# Patient Record
Sex: Female | Born: 1980 | Race: Black or African American | Hispanic: No | Marital: Single | Smoking: Former smoker
Health system: Southern US, Community
[De-identification: ages and names within clinical notes are randomized; demographics above are authoritative.]

## PROBLEM LIST (undated history)

## (undated) ENCOUNTER — Ambulatory Visit (HOSPITAL_COMMUNITY): Admission: EM | Payer: Medicaid Other | Source: Home / Self Care

## (undated) DIAGNOSIS — O24419 Gestational diabetes mellitus in pregnancy, unspecified control: Secondary | ICD-10-CM

## (undated) DIAGNOSIS — O139 Gestational [pregnancy-induced] hypertension without significant proteinuria, unspecified trimester: Secondary | ICD-10-CM

## (undated) DIAGNOSIS — R51 Headache: Secondary | ICD-10-CM

---

## 2012-02-04 ENCOUNTER — Encounter (HOSPITAL_COMMUNITY): Payer: Self-pay

## 2012-02-04 ENCOUNTER — Emergency Department (INDEPENDENT_AMBULATORY_CARE_PROVIDER_SITE_OTHER)
Admission: EM | Admit: 2012-02-04 | Discharge: 2012-02-04 | Disposition: A | Discharge status: Home / Self Care | Payer: Medicaid Other | Source: Home / Self Care | Attending: Family Medicine | Admitting: Family Medicine

## 2012-02-04 DIAGNOSIS — IMO0001 Reserved for inherently not codable concepts without codable children: Secondary | ICD-10-CM

## 2012-02-04 DIAGNOSIS — T148XXA Other injury of unspecified body region, initial encounter: Secondary | ICD-10-CM

## 2012-02-04 DIAGNOSIS — S50362A Insect bite (nonvenomous) of left elbow, initial encounter: Secondary | ICD-10-CM

## 2012-02-04 DIAGNOSIS — W57XXXA Bitten or stung by nonvenomous insect and other nonvenomous arthropods, initial encounter: Secondary | ICD-10-CM

## 2012-02-04 MED ORDER — CLOBETASOL PROPIONATE 0.05 % EX GEL: CUTANEOUS | Status: DC

## 2012-02-04 NOTE — ED Provider Notes (Signed)
History     CSN: [id number]  Arrival date & time 02/04/12  1329   First MD Initiated Contact with Patient 02/04/12 1401      Chief Complaint  Patient presents with  . Rash    (Consider location/radiation/quality/duration/timing/severity/associated sxs/prior treatment) Patient is a 31 y.o. female presenting with rash. The history is provided by the patient.  Rash  This is a recurrent problem. The current episode started more than 2 days ago. The problem has not changed since onset.The problem is associated with an unknown factor. There has been no fever. The rash is present on the left arm. The pain is mild. Associated symptoms include itching.    History reviewed. No pertinent past medical history.  History reviewed. No pertinent past surgical history.  No family history on file.  History  Substance Use Topics  . Smoking status: Former Games developer  . Smokeless tobacco: Not on file  . Alcohol Use: Yes    OB History    Grav Para Term Preterm Abortions TAB SAB Ect Mult Living                  Review of Systems  Constitutional: Negative.   Skin: Positive for itching and rash.    Allergies  Benadryl  Home Medications   Current Outpatient Rx  Name Route Sig Dispense Refill  . CLOBETASOL PROPIONATE 0.05 % EX GEL  Apply to rash sparingly bid 30 each 0    BP 107/63  Pulse 70  Temp(Src) 98.6 F (37 C) (Oral)  Resp 16  SpO2 100%  LMP 01/31/2012  Physical Exam  Nursing note and vitals reviewed. Constitutional: She appears well-developed and well-nourished.  Skin: Skin is warm and dry. Rash noted. There is erythema.       ED Course  Procedures (including critical care time)  Labs Reviewed - No data to display No results found.   1. Insect bite of elbow, left       MDM          Barkley Bruns, MD 02/04/12 [phone]

## 2012-02-04 NOTE — ED Notes (Signed)
Pt c/o rash or bites for past couple of months.  Pt states newest spot in on L elbow onset this weekend.  Area appears red and warm to touch. Pt denies any drainage from area.  Pt states she has had multiple sites similar to this one over the past couple of months.

## 2012-06-03 ENCOUNTER — Encounter (HOSPITAL_COMMUNITY): Payer: Self-pay | Admitting: Emergency Medicine

## 2012-06-03 ENCOUNTER — Emergency Department (HOSPITAL_COMMUNITY)
Admission: EM | Admit: 2012-06-03 | Discharge: 2012-06-03 | Disposition: A | Discharge status: Home / Self Care | Payer: Medicaid Other | Attending: Emergency Medicine | Admitting: Emergency Medicine

## 2012-06-03 DIAGNOSIS — X58XXXA Exposure to other specified factors, initial encounter: Secondary | ICD-10-CM | POA: Insufficient documentation

## 2012-06-03 DIAGNOSIS — R51 Headache: Secondary | ICD-10-CM | POA: Insufficient documentation

## 2012-06-03 DIAGNOSIS — K089 Disorder of teeth and supporting structures, unspecified: Secondary | ICD-10-CM | POA: Insufficient documentation

## 2012-06-03 DIAGNOSIS — K0889 Other specified disorders of teeth and supporting structures: Secondary | ICD-10-CM

## 2012-06-03 DIAGNOSIS — Z888 Allergy status to other drugs, medicaments and biological substances status: Secondary | ICD-10-CM | POA: Insufficient documentation

## 2012-06-03 DIAGNOSIS — S025XXA Fracture of tooth (traumatic), initial encounter for closed fracture: Secondary | ICD-10-CM | POA: Insufficient documentation

## 2012-06-03 MED ORDER — HYDROCODONE-ACETAMINOPHEN 7.5-500 MG/15ML PO SOLN: 10.0000 mL | Freq: Four times a day (QID) | ORAL | Status: AC | PRN

## 2012-06-03 NOTE — ED Provider Notes (Signed)
History   This chart was scribed for Benny Lennert, MD by Sofie Rower. The patient was seen in room TR02C/TR02C and the patient's care was started at 6:56 PM     CSN: [id number]  Arrival date & time 06/03/12  1706   None     Chief Complaint  Patient presents with  . Dental Pain    (Consider location/radiation/quality/duration/timing/severity/associated sxs/prior treatment) Patient is a 31 y.o. female presenting with tooth pain. The history is provided by the patient. No language interpreter was used.  Dental PainThe primary symptoms include mouth pain and headaches. Primary symptoms do not include cough. The symptoms began more than 1 week ago. The symptoms are worsening. The symptoms are new. The symptoms occur constantly.  The headache developed suddenly. Headache is a new problem. The headache is present intermittently. The headache is associated with eating.  Additional symptoms include: pain with swallowing. Additional symptoms do not include: fatigue.      History  Substance Use Topics  . Smoking status: Passive Smoker  . Smokeless tobacco: Not on file  . Alcohol Use: No     occasion    OB History    Grav Para Term Preterm Abortions TAB SAB Ect Mult Living                  Review of Systems  Constitutional: Negative for fatigue.  HENT: Negative for congestion, sinus pressure and ear discharge.   Eyes: Negative.   Respiratory: Negative for cough.   Cardiovascular: Negative for chest pain.  Gastrointestinal: Negative for abdominal pain and diarrhea.  Genitourinary: Negative for frequency and hematuria.  Musculoskeletal: Negative for back pain.  Skin: Negative for rash.  Neurological: Positive for headaches. Negative for seizures.  Hematological: Negative.   Psychiatric/Behavioral: Negative for hallucinations.    Allergies  Benadryl  Home Medications   Current Outpatient Rx  Name Route Sig Dispense Refill  . ACETAMINOPHEN-CODEINE 120-12 MG/5ML PO SUSP  Oral Take 10 mLs by mouth every 6 (six) hours as needed. For pain.    Marland Kitchen PENICILLIN V POTASSIUM 250 MG/5ML PO SOLR Oral Take 500 mg by mouth every 8 (eight) hours.      BP 129/73  Pulse 80  Temp 98.8 F (37.1 C) (Oral)  Resp 19  SpO2 99%  Physical Exam  Nursing note and vitals reviewed. Constitutional: She is oriented to person, place, and time. She appears well-developed.  HENT:  Head: Normocephalic.       Upper Left posterior molar broken.   Eyes: Conjunctivae are normal.  Neck: No tracheal deviation present.  Cardiovascular:  No murmur heard. Musculoskeletal: Normal range of motion.  Neurological: She is oriented to person, place, and time.  Skin: Skin is warm.  Psychiatric: She has a normal mood and affect.    ED Course  Procedures (including critical care time)  DIAGNOSTIC STUDIES: Oxygen Saturation is 99% on room air, normal by my interpretation.    COORDINATION OF CARE:  6:59PM- EDP at bedside discusses treatment plan concerning pain management and follow up with dentist.    Labs Reviewed - No data to display No results found.   No diagnosis found.    MDM        The chart was scribed for me under my direct supervision.  I personally performed the history, physical, and medical decision making and all procedures in the evaluation of this patient.Benny Lennert, MD 06/03/12 Ebony Cargo

## 2012-06-03 NOTE — Discharge Instructions (Signed)
Follow up with dr. Lucky Cowboy next week.

## 2012-06-03 NOTE — ED Notes (Signed)
Pt reports L upper molar has been hurting for some time now; called dentist- unable to get appt until august; pt reports L side of head throbbing d/t pain in tooth

## 2012-08-12 LAB — OB RESULTS CONSOLE HEPATITIS B SURFACE ANTIGEN: Hepatitis B Surface Ag: NEGATIVE

## 2012-11-25 LAB — OB RESULTS CONSOLE RPR: RPR: NONREACTIVE

## 2012-11-25 LAB — OB RESULTS CONSOLE HIV ANTIBODY (ROUTINE TESTING): HIV: NONREACTIVE

## 2012-12-08 NOTE — L&D Delivery Note (Signed)
Delivery Note At 4:33 AM a viable female was delivered via Vaginal, Spontaneous Delivery (Presentation: ; Occiput Anterior). Weight: 9\' 11"  .   Placenta status: Intact, Spontaneous.  Cord: 3 vessels with the following complications: loose nuchal cord x 1.    Anesthesia: None  Episiotomy: None Lacerations: 1st degree Suture Repair: 2.0 vicryl rapide Est. Blood Loss (mL): 200 ml  Mom to AICU.  Baby to nursery-stable.  JACKSON-MOORE,Delta Pichon A 02/03/2013, 4:58 AM

## 2013-01-04 ENCOUNTER — Other Ambulatory Visit: Payer: Self-pay | Admitting: Obstetrics & Gynecology

## 2013-01-17 LAB — OB RESULTS CONSOLE GBS: GBS: POSITIVE

## 2013-01-26 LAB — OB RESULTS CONSOLE ABO/RH: RH Type: POSITIVE

## 2013-01-26 LAB — OB RESULTS CONSOLE ANTIBODY SCREEN: Antibody Screen: NEGATIVE

## 2013-01-26 LAB — OB RESULTS CONSOLE HIV ANTIBODY (ROUTINE TESTING): HIV: NONREACTIVE

## 2013-01-26 LAB — OB RESULTS CONSOLE RUBELLA ANTIBODY, IGM: Rubella: IMMUNE

## 2013-01-31 ENCOUNTER — Inpatient Hospital Stay (HOSPITAL_COMMUNITY)
Admission: RE | Admit: 2013-01-31 | Discharge: 2013-01-31 | Disposition: A | Discharge status: Hospice | Payer: Medicaid Other | Source: Ambulatory Visit | Attending: Obstetrics & Gynecology | Admitting: Obstetrics & Gynecology

## 2013-01-31 ENCOUNTER — Encounter (HOSPITAL_COMMUNITY): Payer: Self-pay

## 2013-01-31 DIAGNOSIS — O10019 Pre-existing essential hypertension complicating pregnancy, unspecified trimester: Secondary | ICD-10-CM | POA: Insufficient documentation

## 2013-01-31 DIAGNOSIS — R51 Headache: Secondary | ICD-10-CM | POA: Insufficient documentation

## 2013-01-31 HISTORY — DX: Gestational diabetes mellitus in pregnancy, unspecified control: O24.419

## 2013-01-31 HISTORY — DX: Headache: R51

## 2013-01-31 HISTORY — DX: Gestational (pregnancy-induced) hypertension without significant proteinuria, unspecified trimester: O13.9

## 2013-01-31 LAB — COMPREHENSIVE METABOLIC PANEL
ALT: 14 U/L (ref 0–35)
AST: 18 U/L (ref 0–37)
Albumin: 2.5 g/dL — ABNORMAL LOW (ref 3.5–5.2)
Alkaline Phosphatase: 225 U/L — ABNORMAL HIGH (ref 39–117)
BUN: 4 mg/dL — ABNORMAL LOW (ref 6–23)
CO2: 23 mEq/L (ref 19–32)
Calcium: 9 mg/dL (ref 8.4–10.5)
Chloride: 104 mEq/L (ref 96–112)
Creatinine, Ser: 0.82 mg/dL (ref 0.50–1.10)
GFR calc Af Amer: >90 mL/min (ref >90)
GFR calc non Af Amer: >90 mL/min (ref >90)
Glucose, Bld: 104 mg/dL — ABNORMAL HIGH (ref 70–99)
Potassium: 3.7 mEq/L (ref 3.5–5.1)
Sodium: 137 mEq/L (ref 135–145)
Total Bilirubin: 0.2 mg/dL — ABNORMAL LOW (ref 0.3–1.2)
Total Protein: 6.1 g/dL (ref 6.0–8.3)

## 2013-01-31 LAB — LACTATE DEHYDROGENASE: LDH: 207 U/L (ref 94–250)

## 2013-01-31 LAB — CBC
HCT: 32.8 % — ABNORMAL LOW (ref 36.0–46.0)
Hemoglobin: 10.5 g/dL — ABNORMAL LOW (ref 12.0–15.0)
MCH: 25.1 pg — ABNORMAL LOW (ref 26.0–34.0)
MCHC: 32 g/dL (ref 30.0–36.0)
MCV: 78.3 fL (ref 78.0–100.0)
Platelets: 172 10*3/uL (ref 150–400)
RBC: 4.19 MIL/uL (ref 3.87–5.11)
RDW: 15.1 % (ref 11.5–15.5)
WBC: 7.8 10*3/uL (ref 4.0–10.5)

## 2013-01-31 LAB — URINALYSIS, ROUTINE W REFLEX MICROSCOPIC
Bilirubin Urine: NEGATIVE
Glucose, UA: NEGATIVE mg/dL
Hgb urine dipstick: NEGATIVE
Ketones, ur: NEGATIVE mg/dL
Leukocytes, UA: NEGATIVE
Nitrite: NEGATIVE
Protein, ur: NEGATIVE mg/dL
Specific Gravity, Urine: <1.005 — ABNORMAL LOW (ref 1.005–1.030)
Urobilinogen, UA: 0.2 mg/dL (ref 0.0–1.0)
pH: 6 (ref 5.0–8.0)

## 2013-01-31 LAB — URIC ACID: Uric Acid, Serum: 4.5 mg/dL (ref 2.4–7.0)

## 2013-01-31 LAB — PROTEIN / CREATININE RATIO, URINE
Creatinine, Urine: 35.44 mg/dL
Protein Creatinine Ratio: 0.4 — ABNORMAL HIGH (ref 0.00–0.15)
Total Protein, Urine: 14 mg/dL

## 2013-01-31 NOTE — MAU Note (Signed)
Patient states she was sent from the office for evaluation of elevated blood pressure. Patient states she has been having headaches for a week and swelling for at least a month and getting worse. Contractions are getting stronger. Denies leaking or bleeding and reports good fetal movement.

## 2013-01-31 NOTE — MAU Provider Note (Signed)
  History     CSN: [id number]  Arrival date and time: 01/31/13 1520   None     Chief Complaint  Patient presents with  . Hypertension  . Headache  . Leg Swelling  . Labor Eval   HPI 32 y.o. [id number] at [redacted]w[redacted]d sent from office for PIH eval. + headache x 1 week - hasn't taken any meds for relief, comes and goes. No vision changes. No epigastric/RUQ pain. + fetal movement. H/O preeclampsia in 2 prior pregnancies.    Past Medical History  Diagnosis Date  . Pregnancy induced hypertension   . Gestational diabetes   . Headache     History reviewed. No pertinent past surgical history.  Family History  Problem Relation Age of Onset  . Family history unknown: Yes    History  Substance Use Topics  . Smoking status: Passive Smoke Exposure - Never Smoker  . Smokeless tobacco: Not on file  . Alcohol Use: No     Comment: occasion    Allergies:  Allergies  Allergen Reactions  . Benadryl (Diphenhydramine Hcl) Swelling    No prescriptions prior to admission    Review of Systems  Constitutional: Negative.   Eyes: Negative for blurred vision.  Respiratory: Negative.   Cardiovascular: Negative.   Gastrointestinal: Negative for nausea, vomiting, abdominal pain, diarrhea and constipation.  Genitourinary: Negative for dysuria, urgency, frequency, hematuria and flank pain.       Negative for vaginal bleeding, + cramping/contractions  Musculoskeletal: Negative.   Neurological: Positive for headaches.  Psychiatric/Behavioral: Negative.    Physical Exam   Blood pressure 138/97, pulse 91, temperature 98.2 F (36.8 C), temperature source Oral, resp. rate 20, height 5\' 7"  (1.702 m), weight 275 lb (124.739 kg), SpO2 100.00%.  Filed Vitals:   01/31/13 1608 01/31/13 1613 01/31/13 1633 01/31/13 1744  BP: 150/92 154/96 138/97 138/97  Pulse: 87 81 80 91  Temp:      TempSrc:      Resp:      Height: 5\' 7"  (1.702 m)     Weight: 275 lb (124.739 kg)     SpO2:        Physical Exam   Nursing note and vitals reviewed. Constitutional: She is oriented to person, place, and time. She appears well-developed and well-nourished.  Cardiovascular: Normal rate.   Respiratory: Effort normal.  GI: Soft. There is no tenderness.  Musculoskeletal: Normal range of motion.  Neurological: She is alert and oriented to person, place, and time. She has normal reflexes.  Skin: Skin is warm and dry.  Psychiatric: She has a normal mood and affect.   EFM reactive, TOCO irreg  MAU Course  Procedures PIH labs ordered - CBC, CMP, Uric acid, LDH, Urinalysis and Urine Protein/Creatinine ratio pending  Assessment and Plan  RN to report labs to Dr. Gaynell Face for plan of care  [facility] 01/31/2013, 9:47 PM

## 2013-02-02 ENCOUNTER — Encounter (HOSPITAL_COMMUNITY): Payer: Self-pay | Admitting: *Deleted

## 2013-02-02 ENCOUNTER — Inpatient Hospital Stay (HOSPITAL_COMMUNITY)
Admission: AD | Admit: 2013-02-02 | Discharge: 2013-02-05 | DRG: 775 | Disposition: A | Discharge status: Home / Self Care | Payer: Medicaid Other | Source: Ambulatory Visit | Attending: Obstetrics & Gynecology | Admitting: Obstetrics & Gynecology

## 2013-02-02 DIAGNOSIS — IMO0002 Reserved for concepts with insufficient information to code with codable children: Secondary | ICD-10-CM | POA: Diagnosis not present

## 2013-02-02 DIAGNOSIS — O139 Gestational [pregnancy-induced] hypertension without significant proteinuria, unspecified trimester: Secondary | ICD-10-CM | POA: Diagnosis present

## 2013-02-02 DIAGNOSIS — O9903 Anemia complicating the puerperium: Secondary | ICD-10-CM | POA: Diagnosis not present

## 2013-02-02 DIAGNOSIS — O99892 Other specified diseases and conditions complicating childbirth: Secondary | ICD-10-CM | POA: Diagnosis present

## 2013-02-02 DIAGNOSIS — D649 Anemia, unspecified: Secondary | ICD-10-CM | POA: Diagnosis not present

## 2013-02-02 DIAGNOSIS — Z22330 Carrier of Group B streptococcus: Secondary | ICD-10-CM

## 2013-02-02 LAB — COMPREHENSIVE METABOLIC PANEL WITH GFR
ALT: 16 U/L (ref 0–35)
AST: 22 U/L (ref 0–37)
Albumin: 2.7 g/dL — ABNORMAL LOW (ref 3.5–5.2)
Alkaline Phosphatase: 235 U/L — ABNORMAL HIGH (ref 39–117)
BUN: 4 mg/dL — ABNORMAL LOW (ref 6–23)
CO2: 22 meq/L (ref 19–32)
Calcium: 9.8 mg/dL (ref 8.4–10.5)
Chloride: 103 meq/L (ref 96–112)
Creatinine, Ser: 0.87 mg/dL (ref 0.50–1.10)
GFR calc Af Amer: >90 mL/min
GFR calc non Af Amer: 89 mL/min — ABNORMAL LOW
Glucose, Bld: 73 mg/dL (ref 70–99)
Potassium: 3.9 meq/L (ref 3.5–5.1)
Sodium: 135 meq/L (ref 135–145)
Total Bilirubin: 0.2 mg/dL — ABNORMAL LOW (ref 0.3–1.2)
Total Protein: 6.3 g/dL (ref 6.0–8.3)

## 2013-02-02 LAB — URIC ACID: Uric Acid, Serum: 4.5 mg/dL (ref 2.4–7.0)

## 2013-02-02 LAB — CBC
HCT: 33.4 % — ABNORMAL LOW (ref 36.0–46.0)
Hemoglobin: 10.9 g/dL — ABNORMAL LOW (ref 12.0–15.0)
MCH: 25.5 pg — ABNORMAL LOW (ref 26.0–34.0)
MCHC: 32.6 g/dL (ref 30.0–36.0)
MCV: 78.2 fL (ref 78.0–100.0)
Platelets: 188 10*3/uL (ref 150–400)
RBC: 4.27 MIL/uL (ref 3.87–5.11)
RDW: 15.3 % (ref 11.5–15.5)
WBC: 8.4 10*3/uL (ref 4.0–10.5)

## 2013-02-02 LAB — TYPE AND SCREEN
ABO/RH(D): O POS
Antibody Screen: NEGATIVE

## 2013-02-02 LAB — LACTATE DEHYDROGENASE: LDH: 250 U/L (ref 94–250)

## 2013-02-02 LAB — ABO/RH: ABO/RH(D): O POS

## 2013-02-02 MED ORDER — IBUPROFEN 600 MG PO TABS: 600.0000 mg | ORAL_TABLET | Freq: Four times a day (QID) | ORAL | Status: DC | PRN

## 2013-02-02 MED ORDER — MAGNESIUM SULFATE BOLUS VIA INFUSION
4.0000 g | Freq: Once | INTRAVENOUS | Status: AC
  Administered 2013-02-02: 4 g via INTRAVENOUS
  Filled 2013-02-02: qty 500

## 2013-02-02 MED ORDER — MAGNESIUM SULFATE 40 G IN LACTATED RINGERS - SIMPLE
2.0000 g/h | INTRAVENOUS | Status: DC
  Administered 2013-02-03: 2 g/h via INTRAVENOUS
  Filled 2013-02-02 (×2): qty 500

## 2013-02-02 MED ORDER — LACTATED RINGERS IV SOLN: INTRAVENOUS | Status: DC

## 2013-02-02 MED ORDER — CITRIC ACID-SODIUM CITRATE 334-500 MG/5ML PO SOLN
30.0000 mL | ORAL | Status: DC | PRN
  Administered 2013-02-02: 30 mL via ORAL
  Filled 2013-02-02: qty 15

## 2013-02-02 MED ORDER — OXYTOCIN 40 UNITS IN LACTATED RINGERS INFUSION - SIMPLE MED
62.5000 mL/h | INTRAVENOUS | Status: DC
  Filled 2013-02-02: qty 1000

## 2013-02-02 MED ORDER — BUTORPHANOL TARTRATE 1 MG/ML IJ SOLN
2.0000 mg | INTRAMUSCULAR | Status: DC | PRN
  Administered 2013-02-03 (×2): 2 mg via INTRAVENOUS
  Filled 2013-02-02 (×2): qty 2

## 2013-02-02 MED ORDER — ONDANSETRON HCL 4 MG/2ML IJ SOLN: 4.0000 mg | Freq: Four times a day (QID) | INTRAMUSCULAR | Status: DC | PRN

## 2013-02-02 MED ORDER — ACETAMINOPHEN 325 MG PO TABS: 650.0000 mg | ORAL_TABLET | ORAL | Status: DC | PRN

## 2013-02-02 MED ORDER — OXYTOCIN 40 UNITS IN LACTATED RINGERS INFUSION - SIMPLE MED
1.0000 m[IU]/min | INTRAVENOUS | Status: DC
  Administered 2013-02-02: 2 m[IU]/min via INTRAVENOUS

## 2013-02-02 MED ORDER — PENICILLIN G POTASSIUM 5000000 UNITS IJ SOLR
5.0000 10*6.[IU] | Freq: Once | INTRAVENOUS | Status: AC
  Administered 2013-02-02: 5 10*6.[IU] via INTRAVENOUS
  Filled 2013-02-02: qty 5

## 2013-02-02 MED ORDER — TERBUTALINE SULFATE 1 MG/ML IJ SOLN: 0.2500 mg | Freq: Once | INTRAMUSCULAR | Status: AC | PRN

## 2013-02-02 MED ORDER — LACTATED RINGERS IV SOLN: 500.0000 mL | INTRAVENOUS | Status: DC | PRN

## 2013-02-02 MED ORDER — PENICILLIN G POTASSIUM 5000000 UNITS IJ SOLR
2.5000 10*6.[IU] | INTRAVENOUS | Status: DC
  Administered 2013-02-03 (×2): 2.5 10*6.[IU] via INTRAVENOUS
  Filled 2013-02-02 (×5): qty 2.5

## 2013-02-02 MED ORDER — LIDOCAINE HCL (PF) 1 % IJ SOLN
30.0000 mL | INTRAMUSCULAR | Status: DC | PRN
  Administered 2013-02-03: 30 mL via SUBCUTANEOUS
  Filled 2013-02-02 (×2): qty 30

## 2013-02-02 MED ORDER — OXYTOCIN BOLUS FROM INFUSION
500.0000 mL | INTRAVENOUS | Status: DC
  Administered 2013-02-03: 500 mL via INTRAVENOUS

## 2013-02-02 MED ORDER — OXYCODONE-ACETAMINOPHEN 5-325 MG PO TABS: 1.0000 | ORAL_TABLET | ORAL | Status: DC | PRN

## 2013-02-02 NOTE — H&P (Signed)
Alejandra Patel is a 32 y.o. female presenting for elevated B/Ps. Maternal Medical History:  Reason for admission: She presented to the office today with elevated B/Ps--140/90.  She also complained of an episodic, right-sided, parietal HA for 1 week with worsening LE edema.  There is a h/o PIH with prior pregnancies.  Fetal activity: Perceived fetal activity is normal.    Prenatal complications: no prenatal complications   OB History   Grav Para Term Preterm Abortions TAB SAB Ect Mult Living   4 2 2  1   1  2      Past Medical History  Diagnosis Date  . Pregnancy induced hypertension   . Headache   . Gestational diabetes     with g2 and g3   History reviewed. No pertinent past surgical history. Family History: family history is not on file. Social History:  reports that she has been passively smoking.  She does not have any smokeless tobacco history on file. She reports that she does not drink alcohol or use illicit drugs.     Review of Systems  Eyes: Negative for blurred vision.  Respiratory: Negative for shortness of breath.   Gastrointestinal: Negative for abdominal pain.  Neurological:       Headache    Dilation: 3 Effacement (%): 50 Station: -3 Exam by:: [employer] Blood pressure 136/96, pulse 104, temperature 98.5 F (36.9 C), temperature source Oral, resp. rate 18, height 5\' 7"  (1.702 m), weight 124.739 kg (275 lb), SpO2 100.00%. Maternal Exam:  Abdomen: Fetal presentation: vertex  Introitus: not evaluated.   Pelvis: adequate for delivery.   Cervix: Cervix evaluated by digital exam.     Fetal Exam Fetal Monitor Review: Variability: moderate (6-25 bpm).   Pattern: accelerations present and no decelerations.    Fetal State Assessment: Category I - tracings are normal.     Physical Exam  Constitutional: She appears well-developed.  HENT:  Head: Normocephalic.  Neck: Neck supple. No thyromegaly present.  Cardiovascular: Normal rate and regular rhythm.    Respiratory: Breath sounds normal.  GI: Soft. Bowel sounds are normal.  Skin: No rash noted.    Prenatal labs: ABO, Rh: --/--/O POS, O POS (02/26 1855) Antibody: NEG (02/26 1855) Rubella: Immune (02/19 0000) RPR: Nonreactive (12/19 0000)  HBsAg: Negative (09/05 0000)  HIV: Non-reactive (02/19 0000)  GBS: Positive (02/10 0000)   Results for orders placed during the hospital encounter of 02/02/13 (from the past 24 hour(s))  URIC ACID     Status: None   Collection Time    02/02/13  6:55 PM      Result Value Range   Uric Acid, Serum 4.5  2.4 - 7.0 mg/dL  LACTATE DEHYDROGENASE     Status: None   Collection Time    02/02/13  6:55 PM      Result Value Range   LDH 250  94 - 250 U/L  COMPREHENSIVE METABOLIC PANEL     Status: Abnormal   Collection Time    02/02/13  6:55 PM      Result Value Range   Sodium 135  135 - 145 mEq/L   Potassium 3.9  3.5 - 5.1 mEq/L   Chloride 103  96 - 112 mEq/L   CO2 22  19 - 32 mEq/L   Glucose, Bld 73  70 - 99 mg/dL   BUN 4 (*) 6 - 23 mg/dL   Creatinine, Ser 5.40  0.50 - 1.10 mg/dL   Calcium 9.8  8.4 - 98.1  mg/dL   Total Protein 6.3  6.0 - 8.3 g/dL   Albumin 2.7 (*) 3.5 - 5.2 g/dL   AST 22  0 - 37 U/L   ALT 16  0 - 35 U/L   Alkaline Phosphatase 235 (*) 39 - 117 U/L   Total Bilirubin 0.2 (*) 0.3 - 1.2 mg/dL   GFR calc non Af Amer 89 (*) >90 mL/min   GFR calc Af Amer >90  >90 mL/min  CBC     Status: Abnormal   Collection Time    02/02/13  6:55 PM      Result Value Range   WBC 8.4  4.0 - 10.5 K/uL   RBC 4.27  3.87 - 5.11 MIL/uL   Hemoglobin 10.9 (*) 12.0 - 15.0 g/dL   HCT 60.4 (*) 54.0 - 98.1 %   MCV 78.2  78.0 - 100.0 fL   MCH 25.5 (*) 26.0 - 34.0 pg   MCHC 32.6  30.0 - 36.0 g/dL   RDW 19.1  47.8 - 29.5 %   Platelets 188  150 - 400 K/uL  TYPE AND SCREEN     Status: None   Collection Time    02/02/13  6:55 PM      Result Value Range   ABO/RH(D) O POS     Antibody Screen NEG     Sample Expiration 02/05/2013    ABO/RH     Status: None    Collection Time    02/02/13  6:55 PM      Result Value Range   ABO/RH(D) O POS      Assessment/Plan: Multipara w/an IUP @ [redacted]w[redacted]d.  PIH--B/P, neurological symptoms meet criteria for severe PIH.  GBS pos.  Category I FHT.  Admit PCN GBS prophylaxis MgSO4 for seizure prophylaxis Low dose Pitocin per protocol/AROM    JACKSON-MOORE,Beckham Capistran A 02/02/2013, 10:25 PM

## 2013-02-02 NOTE — Progress Notes (Signed)
Dr Tamela Oddi notified of pt admission status, FHR tracing, Ucs, SVE, and mat BPs. Requesting POC

## 2013-02-03 ENCOUNTER — Encounter (HOSPITAL_COMMUNITY): Payer: Self-pay | Admitting: *Deleted

## 2013-02-03 LAB — RPR: RPR Ser Ql: NONREACTIVE

## 2013-02-03 LAB — MRSA PCR SCREENING: MRSA by PCR: NEGATIVE

## 2013-02-03 MED ORDER — TETANUS-DIPHTH-ACELL PERTUSSIS 5-2.5-18.5 LF-MCG/0.5 IM SUSP
0.5000 mL | Freq: Once | INTRAMUSCULAR | Status: DC
  Filled 2013-02-03: qty 0.5

## 2013-02-03 MED ORDER — MEDROXYPROGESTERONE ACETATE 150 MG/ML IM SUSP: 150.0000 mg | INTRAMUSCULAR | Status: DC | PRN

## 2013-02-03 MED ORDER — DIBUCAINE 1 % RE OINT: 1.0000 "application " | TOPICAL_OINTMENT | RECTAL | Status: DC | PRN

## 2013-02-03 MED ORDER — ONDANSETRON HCL 4 MG PO TABS: 4.0000 mg | ORAL_TABLET | ORAL | Status: DC | PRN

## 2013-02-03 MED ORDER — PRENATAL MULTIVITAMIN CH: 1.0000 | ORAL_TABLET | Freq: Every day | ORAL | Status: DC

## 2013-02-03 MED ORDER — IBUPROFEN 100 MG/5ML PO SUSP
600.0000 mg | Freq: Four times a day (QID) | ORAL | Status: DC | PRN
  Administered 2013-02-03 (×2): 600 mg via ORAL
  Filled 2013-02-03 (×2): qty 30

## 2013-02-03 MED ORDER — IBUPROFEN 600 MG PO TABS: 600.0000 mg | ORAL_TABLET | Freq: Four times a day (QID) | ORAL | Status: DC

## 2013-02-03 MED ORDER — BENZOCAINE-MENTHOL 20-0.5 % EX AERO
1.0000 "application " | INHALATION_SPRAY | CUTANEOUS | Status: DC | PRN
  Administered 2013-02-03: 1 via TOPICAL
  Filled 2013-02-03: qty 56

## 2013-02-03 MED ORDER — FERROUS SULFATE 325 (65 FE) MG PO TABS
325.0000 mg | ORAL_TABLET | Freq: Two times a day (BID) | ORAL | Status: DC
  Administered 2013-02-03 – 2013-02-05 (×4): 325 mg via ORAL
  Filled 2013-02-03 (×4): qty 1

## 2013-02-03 MED ORDER — IBUPROFEN 100 MG/5ML PO SUSP
600.0000 mg | Freq: Four times a day (QID) | ORAL | Status: DC
  Administered 2013-02-03 – 2013-02-04 (×6): 600 mg via ORAL
  Filled 2013-02-03 (×12): qty 30

## 2013-02-03 MED ORDER — ZOLPIDEM TARTRATE 5 MG PO TABS: 5.0000 mg | ORAL_TABLET | Freq: Every evening | ORAL | Status: DC | PRN

## 2013-02-03 MED ORDER — COMPLETENATE 29-1 MG PO CHEW
1.0000 | CHEWABLE_TABLET | Freq: Every day | ORAL | Status: DC
  Administered 2013-02-03 – 2013-02-04 (×2): 1 via ORAL
  Filled 2013-02-03 (×4): qty 1

## 2013-02-03 MED ORDER — SENNOSIDES-DOCUSATE SODIUM 8.6-50 MG PO TABS
2.0000 | ORAL_TABLET | Freq: Every day | ORAL | Status: DC
  Administered 2013-02-03 – 2013-02-04 (×2): 2 via ORAL

## 2013-02-03 MED ORDER — WITCH HAZEL-GLYCERIN EX PADS: 1.0000 "application " | MEDICATED_PAD | CUTANEOUS | Status: DC | PRN

## 2013-02-03 MED ORDER — MEASLES, MUMPS & RUBELLA VAC ~~LOC~~ INJ: 0.5000 mL | INJECTION | Freq: Once | SUBCUTANEOUS | Status: DC

## 2013-02-03 MED ORDER — LACTATED RINGERS IV SOLN
INTRAVENOUS | Status: DC
  Administered 2013-02-03 (×2): via INTRAVENOUS

## 2013-02-03 MED ORDER — MAGNESIUM HYDROXIDE 400 MG/5ML PO SUSP
30.0000 mL | ORAL | Status: DC | PRN
  Filled 2013-02-03: qty 30

## 2013-02-03 MED ORDER — OXYCODONE-ACETAMINOPHEN 5-325 MG PO TABS: 1.0000 | ORAL_TABLET | ORAL | Status: DC | PRN

## 2013-02-03 MED ORDER — LANOLIN HYDROUS EX OINT: TOPICAL_OINTMENT | CUTANEOUS | Status: DC | PRN

## 2013-02-03 MED ORDER — ONDANSETRON HCL 4 MG/2ML IJ SOLN: 4.0000 mg | INTRAMUSCULAR | Status: DC | PRN

## 2013-02-03 NOTE — Progress Notes (Signed)
Infant to Nursery so mom can rest, on Magnesium and exhausted.

## 2013-02-03 NOTE — Progress Notes (Signed)
UR chart review completed.  

## 2013-02-04 LAB — CBC
HCT: 25.8 % — ABNORMAL LOW (ref 36.0–46.0)
Hemoglobin: 8.5 g/dL — ABNORMAL LOW (ref 12.0–15.0)
MCH: 25.8 pg — ABNORMAL LOW (ref 26.0–34.0)
MCHC: 32.9 g/dL (ref 30.0–36.0)
MCV: 78.2 fL (ref 78.0–100.0)
Platelets: 142 10*3/uL — ABNORMAL LOW (ref 150–400)
RBC: 3.3 MIL/uL — ABNORMAL LOW (ref 3.87–5.11)
RDW: 15.3 % (ref 11.5–15.5)
WBC: 9.5 10*3/uL (ref 4.0–10.5)

## 2013-02-04 LAB — COMPREHENSIVE METABOLIC PANEL
ALT: 14 U/L (ref 0–35)
AST: 19 U/L (ref 0–37)
Albumin: 2.1 g/dL — ABNORMAL LOW (ref 3.5–5.2)
Alkaline Phosphatase: 157 U/L — ABNORMAL HIGH (ref 39–117)
BUN: 5 mg/dL — ABNORMAL LOW (ref 6–23)
CO2: 25 mEq/L (ref 19–32)
Calcium: 7.3 mg/dL — ABNORMAL LOW (ref 8.4–10.5)
Chloride: 104 mEq/L (ref 96–112)
Creatinine, Ser: 0.83 mg/dL (ref 0.50–1.10)
GFR calc Af Amer: >90 mL/min (ref >90)
GFR calc non Af Amer: >90 mL/min (ref >90)
Glucose, Bld: 93 mg/dL (ref 70–99)
Potassium: 3.9 mEq/L (ref 3.5–5.1)
Sodium: 136 mEq/L (ref 135–145)
Total Bilirubin: 0.1 mg/dL — ABNORMAL LOW (ref 0.3–1.2)
Total Protein: 5.1 g/dL — ABNORMAL LOW (ref 6.0–8.3)

## 2013-02-04 LAB — LACTATE DEHYDROGENASE: LDH: 234 U/L (ref 94–250)

## 2013-02-04 NOTE — Progress Notes (Signed)
Post Partum Day 1 Subjective: no complaints  Objective: Blood pressure 127/75, pulse 85, temperature 98.6 F (37 C), temperature source Oral, resp. rate 18, height 5\' 7"  (1.702 m), weight 255 lb 6.4 oz (115.849 kg), SpO2 97.00%, unknown if currently breastfeeding.  Physical Exam:  General: alert and no distress Lochia: appropriate Uterine Fundus: firm Incision: healing well DVT Evaluation: No evidence of DVT seen on physical exam.   Recent Labs  02/02/13 1855 02/04/13 0505  HGB 10.9* 8.5*  HCT 33.4* 25.8*    Assessment/Plan: Plan for discharge tomorrow.  Anemia.  Clinically stable.  Iron started.   LOS: 2 days   Keshara Kiger A 02/04/2013, 7:43 AM

## 2013-02-05 DIAGNOSIS — IMO0002 Reserved for concepts with insufficient information to code with codable children: Secondary | ICD-10-CM | POA: Diagnosis not present

## 2013-02-05 MED ORDER — FERROUS SULFATE 325 (65 FE) MG PO TABS: 325.0000 mg | ORAL_TABLET | Freq: Two times a day (BID) | ORAL | Status: DC

## 2013-02-05 MED ORDER — NORETHINDRONE 0.35 MG PO TABS: 1.0000 | ORAL_TABLET | Freq: Every day | ORAL | Status: DC

## 2013-02-05 NOTE — Discharge Summary (Signed)
  Obstetric Discharge Summary Reason for Admission: induction of labor Prenatal Procedures: none Intrapartum Procedures: spontaneous vaginal delivery Postpartum Procedures: none Complications-Operative and Postpartum: none  Hemoglobin  Date Value Range Status  02/04/2013 8.5* 12.0 - 15.0 g/dL Final     REPEATED TO VERIFY     DELTA CHECK NOTED     HCT  Date Value Range Status  02/04/2013 25.8* 36.0 - 46.0 % Final    Physical Exam:  General: alert Lochia: appropriate Uterine: firm Incision: n/a DVT Evaluation: No evidence of DVT seen on physical exam.  Discharge Diagnoses: Active Problems:   PIH (pregnancy induced hypertension)   Normal delivery   Maternal anemia complicating pregnancy, childbirth, or the puerperium   Discharge Information: Date: 02/05/2013 Activity: pelvic rest Diet: routine Medications:  Prior to Admission medications   Medication Sig Start Date End Date Taking? Authorizing Provider  calcium carbonate (TUMS EX) 750 MG chewable tablet Chew 1 tablet by mouth daily as needed for heartburn.   Yes Historical Provider, MD  Prenatal Vit-Fe Fumarate-FA (PRENATAL MULTIVITAMIN) TABS Take 1 tablet by mouth daily.   Yes Historical Provider, MD  ferrous sulfate 325 (65 FE) MG tablet Take 1 tablet (325 mg total) by mouth 2 (two) times daily with a meal. 02/05/13   Antionette Char, MD  norethindrone (ORTHO MICRONOR) 0.35 MG tablet Take 1 tablet (0.35 mg total) by mouth daily. 2nd Sunday start 02/05/13   Antionette Char, MD    Condition: stable Instructions: refer to routine discharge instructions Discharge to: home Follow-up Information   Follow up with Antionette Char A, MD. Schedule an appointment as soon as possible for a visit in 2 days.   Contact information:   [address] [place] [address] [phone]       Newborn Data: Live born  Information for the patient's newborn:  Shaunika, Italiano [[id number]]  female ; APGAR (1 MIN): 8    APGAR (5 MINS): 9    Home with mother.  JACKSON-MOORE,Brooklyne Radke A 02/05/2013, 10:53 AM

## 2013-02-08 ENCOUNTER — Telehealth (HOSPITAL_COMMUNITY): Payer: Self-pay | Admitting: *Deleted

## 2013-02-08 NOTE — Telephone Encounter (Signed)
Resolve episode 

## 2013-02-25 ENCOUNTER — Other Ambulatory Visit: Payer: Self-pay | Admitting: *Deleted

## 2014-03-20 ENCOUNTER — Encounter: Payer: Self-pay | Admitting: Obstetrics

## 2014-03-20 ENCOUNTER — Ambulatory Visit (INDEPENDENT_AMBULATORY_CARE_PROVIDER_SITE_OTHER): Payer: Medicaid Other | Admitting: Obstetrics

## 2014-03-20 VITALS — BP 133/86 | HR 60 | Temp 99.0°F | Wt 242.0 lb

## 2014-03-20 DIAGNOSIS — N939 Abnormal uterine and vaginal bleeding, unspecified: Secondary | ICD-10-CM

## 2014-03-20 DIAGNOSIS — Z0000 Encounter for general adult medical examination without abnormal findings: Secondary | ICD-10-CM

## 2014-03-20 DIAGNOSIS — Z3009 Encounter for other general counseling and advice on contraception: Secondary | ICD-10-CM

## 2014-03-20 DIAGNOSIS — N926 Irregular menstruation, unspecified: Secondary | ICD-10-CM

## 2014-03-20 LAB — POCT URINALYSIS DIPSTICK
Bilirubin, UA: NEGATIVE
Glucose, UA: NEGATIVE
Ketones, UA: NEGATIVE
Leukocytes, UA: NEGATIVE
Nitrite, UA: NEGATIVE
Spec Grav, UA: 1.02
Urobilinogen, UA: NEGATIVE
pH, UA: 5

## 2014-03-20 NOTE — Addendum Note (Signed)
Addended by: Marya LandryFOSTER, Orianna Biskup D on: 03/20/2014 04:38 PM   Modules accepted: Orders

## 2014-03-20 NOTE — Progress Notes (Signed)
Subjective:     Alejandra Patel is a 33 y.o. female here for a routine annual exam and pap smear.  Current complaints: Pt states that she had not had a cycle for the month March.  Pt states that on April 6 she had excruciating pain in her hip and groin area that radiated down to knee.  Pt then awakened to her cycle starting the next morning.  Pt states she than had a large clot to pass and she called 911.  EMS reassured her that she was fine and to f/u with OB/GYN.  Pt state that she had several other small clots afterward.  Pt had heavy bleeding with this cycle and lasted for 7 days.  Pt states that she would like to discuss hormones.  The HPI was reviewed and explored in further detail by the provider. Gynecologic History No LMP recorded. Contraception: none Last Pap:2014 . Results were: normal Last mammogram: n/a  Obstetric History OB History  Gravida Para Term Preterm AB SAB TAB Ectopic Multiple Living  4 3 3  1   1  3     # Outcome Date GA Lbr Len/2nd Weight Sex Delivery Anes PTL Lv  4 TRM 02/03/13 [id number] 05:46 / 00:08 9 lb 11.7 oz (4.414 kg) M SVD None  Y  3 TRM           2 TRM           1 ECT                The following portions of the patient's history were reviewed and updated as appropriate: allergies, current medications, past family history, past medical history, past social history, past surgical history and problem list.  Review of Systems A comprehensive review of systems was negative except for: Genitourinary: positive for abnormal menstrual periods Musculoskeletal: positive for arthralgias    Objective:    General appearance: alert and no distress Breasts: normal appearance, no masses or tenderness Abdomen: normal findings: soft, non-tender Pelvic: cervix normal in appearance, external genitalia normal, no adnexal masses or tenderness, no cervical motion tenderness, rectovaginal septum normal, uterus normal size, shape, and consistency, vagina normal without  discharge and scant amount of menstrual blood noted in vagina Extremities: extremities normal, atraumatic, no cyanosis or edema      Assessment:    Healthy female exam.   AUB  HTN, mild   Plan:    Education reviewed: safe sex/STD prevention, self breast exams, weight bearing exercise and management of AUB. Contraception: none. Follow up in: 2 months.   Considering Nexplanon

## 2014-03-21 LAB — GC/CHLAMYDIA PROBE AMP
CT Probe RNA: NEGATIVE
GC Probe RNA: NEGATIVE

## 2014-03-21 LAB — PAP IG W/ RFLX HPV ASCU

## 2014-03-21 LAB — WET PREP BY MOLECULAR PROBE
Candida species: NEGATIVE
Gardnerella vaginalis: NEGATIVE
Trichomonas vaginosis: NEGATIVE

## 2014-05-22 ENCOUNTER — Ambulatory Visit: Payer: Medicaid Other | Admitting: Obstetrics

## 2014-05-23 ENCOUNTER — Ambulatory Visit: Payer: Medicaid Other | Admitting: Obstetrics

## 2014-06-12 ENCOUNTER — Ambulatory Visit: Payer: Medicaid Other | Admitting: Obstetrics & Gynecology

## 2014-06-13 ENCOUNTER — Inpatient Hospital Stay (HOSPITAL_COMMUNITY)
Admission: AD | Admit: 2014-06-13 | Discharge: 2014-06-13 | Disposition: A | Discharge status: Home / Self Care | Payer: Medicaid Other | Source: Ambulatory Visit | Attending: Obstetrics | Admitting: Obstetrics

## 2014-06-13 ENCOUNTER — Encounter (HOSPITAL_COMMUNITY): Payer: Self-pay | Admitting: *Deleted

## 2014-06-13 ENCOUNTER — Inpatient Hospital Stay (HOSPITAL_COMMUNITY): Payer: Medicaid Other

## 2014-06-13 DIAGNOSIS — O4690 Antepartum hemorrhage, unspecified, unspecified trimester: Secondary | ICD-10-CM

## 2014-06-13 DIAGNOSIS — R071 Chest pain on breathing: Secondary | ICD-10-CM | POA: Insufficient documentation

## 2014-06-13 DIAGNOSIS — N390 Urinary tract infection, site not specified: Secondary | ICD-10-CM | POA: Insufficient documentation

## 2014-06-13 DIAGNOSIS — O2390 Unspecified genitourinary tract infection in pregnancy, unspecified trimester: Secondary | ICD-10-CM | POA: Insufficient documentation

## 2014-06-13 DIAGNOSIS — O99330 Smoking (tobacco) complicating pregnancy, unspecified trimester: Secondary | ICD-10-CM | POA: Diagnosis not present

## 2014-06-13 DIAGNOSIS — O209 Hemorrhage in early pregnancy, unspecified: Secondary | ICD-10-CM | POA: Insufficient documentation

## 2014-06-13 DIAGNOSIS — R51 Headache: Secondary | ICD-10-CM | POA: Diagnosis not present

## 2014-06-13 LAB — CBC
HCT: 34.8 % — ABNORMAL LOW (ref 36.0–46.0)
Hemoglobin: 10.9 g/dL — ABNORMAL LOW (ref 12.0–15.0)
MCH: 24.3 pg — ABNORMAL LOW (ref 26.0–34.0)
MCHC: 31.3 g/dL (ref 30.0–36.0)
MCV: 77.7 fL — ABNORMAL LOW (ref 78.0–100.0)
Platelets: 275 10*3/uL (ref 150–400)
RBC: 4.48 MIL/uL (ref 3.87–5.11)
RDW: 15.6 % — ABNORMAL HIGH (ref 11.5–15.5)
WBC: 6.2 10*3/uL (ref 4.0–10.5)

## 2014-06-13 LAB — URINE MICROSCOPIC-ADD ON

## 2014-06-13 LAB — URINALYSIS, ROUTINE W REFLEX MICROSCOPIC
Bilirubin Urine: NEGATIVE
Glucose, UA: NEGATIVE mg/dL
Ketones, ur: 15 mg/dL — AB
Nitrite: POSITIVE — AB
Protein, ur: 100 mg/dL — AB
Specific Gravity, Urine: 1.025 (ref 1.005–1.030)
Urobilinogen, UA: 1 mg/dL (ref 0.0–1.0)
pH: 5.5 (ref 5.0–8.0)

## 2014-06-13 LAB — WET PREP, GENITAL
Clue Cells Wet Prep HPF POC: NONE SEEN
Trich, Wet Prep: NONE SEEN
WBC, Wet Prep HPF POC: NONE SEEN
Yeast Wet Prep HPF POC: NONE SEEN

## 2014-06-13 LAB — HCG, QUANTITATIVE, PREGNANCY: hCG, Beta Chain, Quant, S: 43 m[IU]/mL — ABNORMAL HIGH (ref <5)

## 2014-06-13 LAB — POCT PREGNANCY, URINE: Preg Test, Ur: POSITIVE — AB

## 2014-06-13 MED ORDER — CEPHALEXIN 500 MG PO CAPS: 500.0000 mg | ORAL_CAPSULE | Freq: Two times a day (BID) | ORAL | Status: DC

## 2014-06-13 NOTE — MAU Provider Note (Signed)
History     CSN: [id number]  Arrival date and time: 06/13/14 1044   First Provider Initiated Contact with Patient 06/13/14 1154      Chief Complaint  Patient presents with  . Menorrhagia   HPI  Pt is a 33 yo G5P3013 at Unknown wks gestation.  Pt reports headache and dizziness x one month.  Also reports vaginal bleeding that began 6/11 and present everyday.  Bleeding is described as a light period.  No report of passing clots.  Last normal period in January 16, 2014.  History of ectopic pregnancy in 2005 managed with MTX.    Also reports pain on left side of chest that hurts with palpation for "years".  Feels like "I need a good stretch".    Past Medical History  Diagnosis Date  . Pregnancy induced hypertension   . Headache(784.0)   . Gestational diabetes     with g2 and g3    History reviewed. No pertinent past surgical history.  Family History  Problem Relation Age of Onset  . Ovarian cysts Sister     History  Substance Use Topics  . Smoking status: Light Tobacco Smoker  . Smokeless tobacco: Not on file  . Alcohol Use: No     Comment: occasion    Allergies:  Allergies  Allergen Reactions  . Benadryl [Diphenhydramine Hcl] Swelling    No prescriptions prior to admission    ROS Physical Exam   Blood pressure 141/86, pulse 68, temperature 98.7 F (37.1 C), temperature source Oral, resp. rate 20, height 5\' 7"  (1.702 m), weight 102.513 kg (226 lb), last menstrual period 05/19/2014, SpO2 100.00%, unknown if currently breastfeeding.  Physical Exam  Constitutional: She is oriented to person, place, and time. She appears well-developed and well-nourished. No distress.  HENT:  Head: Normocephalic.  Neck: Normal range of motion. Neck supple.  Cardiovascular: Normal rate, regular rhythm and normal heart sounds.   Respiratory: Effort normal and breath sounds normal. No respiratory distress.    GI: Soft. There is tenderness (suprapubic).  Genitourinary: There is  bleeding (moderate; neg clots) around the vagina.  Neurological: She is alert and oriented to person, place, and time.  Skin: Skin is warm and dry.    MAU Course  Procedures Results for orders placed during the hospital encounter of 06/13/14 (from the past 24 hour(s))  URINALYSIS, ROUTINE W REFLEX MICROSCOPIC     Status: Abnormal   Collection Time    06/13/14 11:00 AM      Result Value Ref Range   Color, Urine RED (*) YELLOW   APPearance TURBID (*) CLEAR   Specific Gravity, Urine 1.025  1.005 - 1.030   pH 5.5  5.0 - 8.0   Glucose, UA NEGATIVE  NEGATIVE mg/dL   Hgb urine dipstick LARGE (*) NEGATIVE   Bilirubin Urine NEGATIVE  NEGATIVE   Ketones, ur 15 (*) NEGATIVE mg/dL   Protein, ur 132100 (*) NEGATIVE mg/dL   Urobilinogen, UA 1.0  0.0 - 1.0 mg/dL   Nitrite POSITIVE (*) NEGATIVE   Leukocytes, UA TRACE (*) NEGATIVE  URINE MICROSCOPIC-ADD ON     Status: None   Collection Time    06/13/14 11:00 AM      Result Value Ref Range   Squamous Epithelial / LPF RARE  RARE   WBC, UA 0-2  <3 WBC/hpf   RBC / HPF TOO NUMEROUS TO COUNT  <3 RBC/hpf  POCT PREGNANCY, URINE     Status: Abnormal   Collection Time  06/13/14 11:16 AM      Result Value Ref Range   Preg Test, Ur POSITIVE (*) NEGATIVE  WET PREP, GENITAL     Status: None   Collection Time    06/13/14 12:06 PM      Result Value Ref Range   Yeast Wet Prep HPF POC NONE SEEN  NONE SEEN   Trich, Wet Prep NONE SEEN  NONE SEEN   Clue Cells Wet Prep HPF POC NONE SEEN  NONE SEEN   WBC, Wet Prep HPF POC NONE SEEN  NONE SEEN  CBC     Status: Abnormal   Collection Time    06/13/14 12:14 PM      Result Value Ref Range   WBC 6.2  4.0 - 10.5 K/uL   RBC 4.48  3.87 - 5.11 MIL/uL   Hemoglobin 10.9 (*) 12.0 - 15.0 g/dL   HCT 08.634.8 (*) 57.836.0 - 46.946.0 %   MCV 77.7 (*) 78.0 - 100.0 fL   MCH 24.3 (*) 26.0 - 34.0 pg   MCHC 31.3  30.0 - 36.0 g/dL   RDW 62.915.6 (*) 52.811.5 - 41.315.5 %   Platelets 275  150 - 400 K/uL  HCG, QUANTITATIVE, PREGNANCY     Status:  Abnormal   Collection Time    06/13/14 12:14 PM      Result Value Ref Range   hCG, Beta Chain, Quant, S 43 (*) <5 mIU/mL   Ultrasound: IMPRESSION:  1. No intrauterine gestational sac, yolk sac, or fetal pole  identified. Differential considerations include intrauterine  pregnancy too early to be sonographically visualized, missed  abortion, or ectopic pregnancy. Followup ultrasound is recommended  in 10-14 days for further evaluation.  Assessment and Plan  Bleeding in Pregnancy UTI in Pregnancy Chest Wall Pain  Plan: Discharge to home Repeat quant in 48 hours Take OTC Tylenol for chest wall pain RX Keflex 500 mg BID x 7 days Urine culture to lab  Melissa NoonWalidah N Muhammad, CNM

## 2014-06-13 NOTE — Progress Notes (Signed)
States has had chest pain L upper quad for yrs. Comes and goes but recently has been constant with varying degrees. I sore to touch if pushes hard and radiates to back. Movement doesn't change pain. "Feels like it just needs to stretch"

## 2014-06-13 NOTE — MAU Note (Signed)
Wacky periods since March.  No period in March.  Came in April, bad cramping- down into legs. Passed a humongous clot, called the ambulance- they said she was ok, [facility], has a f/u appt on Thur.  Things have gotten worse.  Period started 06/12, brownish x3 days, then kicked into reg period, then it got worse, really heavy and bad cramping, started to ease of and then came back hard core again, bad cramps and clots.  Now having chest pain- was minimal; has gotten worse through to the back.  occ has shortness of breath.

## 2014-06-13 NOTE — Progress Notes (Signed)
Threasa HeadsW. Muhammed CNM in earlier to discuss test results and d/c plan. Will return in 2 days for repeat labs. Written and verbal d/c instructions given and understanding voiced

## 2014-06-13 NOTE — Discharge Instructions (Signed)
Vaginal Bleeding During Pregnancy, First Trimester  A small amount of bleeding (spotting) from the vagina is relatively common in early pregnancy. It usually stops on its own. Various things may cause bleeding or spotting in early pregnancy. Some bleeding may be related to the pregnancy, and some may not. In most cases, the bleeding is normal and is not a problem. However, bleeding can also be a sign of something serious. Be sure to tell your health care provider about any vaginal bleeding right away.  Some possible causes of vaginal bleeding during the first trimester include:  · Infection or inflammation of the cervix.  · Growths (polyps) on the cervix.  · Miscarriage or threatened miscarriage.  · Pregnancy tissue has developed outside of the uterus and in a fallopian tube (tubal pregnancy).  · Tiny cysts have developed in the uterus instead of pregnancy tissue (molar pregnancy).  HOME CARE INSTRUCTIONS   Watch your condition for any changes. The following actions may help to lessen any discomfort you are feeling:  · Follow your health care provider's instructions for limiting your activity. If your health care provider orders bed rest, you may need to stay in bed and only get up to use the bathroom. However, your health care provider may allow you to continue light activity.  · If needed, make plans for someone to help with your regular activities and responsibilities while you are on bed rest.  · Keep track of the number of pads you use each day, how often you change pads, and how soaked (saturated) they are. Write this down.  · Do not use tampons. Do not douche.  · Do not have sexual intercourse or orgasms until approved by your health care provider.  · If you pass any tissue from your vagina, save the tissue so you can show it to your health care provider.  · Only take over-the-counter or prescription medicines as directed by your health care provider.  · Do not take aspirin because it can make you  bleed.  · Keep all follow-up appointments as directed by your health care provider.  SEEK MEDICAL CARE IF:  · You have any vaginal bleeding during any part of your pregnancy.  · You have cramps or labor pains.  · You have a fever, not controlled by medicine.  SEEK IMMEDIATE MEDICAL CARE IF:   · You have severe cramps in your back or belly (abdomen).  · You pass large clots or tissue from your vagina.  · Your bleeding increases.  · You feel light-headed or weak, or you have fainting episodes.  · You have chills.  · You are leaking fluid or have a gush of fluid from your vagina.  · You pass out while having a bowel movement.  MAKE SURE YOU:  · Understand these instructions.  · Will watch your condition.  · Will get help right away if you are not doing well or get worse.  Document Released: 09/03/2005 Document Revised: 11/29/2013 Document Reviewed: 08/01/2013  ExitCare® Patient Information ©2015 [employer]. This information is not intended to replace advice given to you by your health care provider. Make sure you discuss any questions you have with your health care provider.

## 2014-06-14 ENCOUNTER — Ambulatory Visit: Payer: Medicaid Other | Admitting: Obstetrics & Gynecology

## 2014-06-14 LAB — GC/CHLAMYDIA PROBE AMP
CT Probe RNA: NEGATIVE
GC Probe RNA: NEGATIVE

## 2014-06-15 ENCOUNTER — Encounter (HOSPITAL_COMMUNITY): Payer: Self-pay | Admitting: *Deleted

## 2014-06-15 ENCOUNTER — Inpatient Hospital Stay (HOSPITAL_COMMUNITY)
Admission: AD | Admit: 2014-06-15 | Discharge: 2014-06-15 | Disposition: A | Discharge status: Home / Self Care | Payer: Medicaid Other | Source: Ambulatory Visit | Attending: Obstetrics & Gynecology | Admitting: Obstetrics & Gynecology

## 2014-06-15 ENCOUNTER — Ambulatory Visit: Payer: Medicaid Other | Admitting: Obstetrics & Gynecology

## 2014-06-15 DIAGNOSIS — O039 Complete or unspecified spontaneous abortion without complication: Secondary | ICD-10-CM | POA: Insufficient documentation

## 2014-06-15 DIAGNOSIS — O209 Hemorrhage in early pregnancy, unspecified: Secondary | ICD-10-CM

## 2014-06-15 DIAGNOSIS — F17200 Nicotine dependence, unspecified, uncomplicated: Secondary | ICD-10-CM | POA: Diagnosis not present

## 2014-06-15 LAB — URINE CULTURE
Colony Count: NO GROWTH
Culture: NO GROWTH

## 2014-06-15 LAB — HCG, QUANTITATIVE, PREGNANCY: hCG, Beta Chain, Quant, S: 38 m[IU]/mL — ABNORMAL HIGH

## 2014-06-15 NOTE — MAU Provider Note (Signed)
  History     CSN: [id number]  Arrival date and time: 06/15/14 0918   None     Chief Complaint  Patient presents with  . Follow-up   HPI This is a 33 y.o. here for followup quant hcg. Still has light bleeding, heavy and crampy last week.  Has been a [facility] patient,but did not tell nurse. Was planning on going there this week to get on birth control.  RN note:  Pt c/o rash or bites for past couple of months. Pt states newest spot in on L elbow onset this weekend. Area appears red and warm to touch. Pt denies any drainage from area. Pt states she has had multiple sites similar to this one over the past couple of months.        OB History   Grav Para Term Preterm Abortions TAB SAB Ect Mult Living   5 3 3  1   1  3       Past Medical History  Diagnosis Date  . Pregnancy induced hypertension   . Headache(784.0)   . Gestational diabetes     with g2 and g3    No past surgical history on file.  Family History  Problem Relation Age of Onset  . Ovarian cysts Sister     History  Substance Use Topics  . Smoking status: Light Tobacco Smoker  . Smokeless tobacco: Not on file  . Alcohol Use: No     Comment: occasion    Allergies:  Allergies  Allergen Reactions  . Benadryl [Diphenhydramine Hcl] Swelling    Prescriptions prior to admission  Medication Sig Dispense Refill  . cephALEXin (KEFLEX) 500 MG capsule Take 1 capsule (500 mg total) by mouth 2 (two) times daily.  14 capsule  0    Review of Systems  Constitutional: Negative for fever, chills and malaise/fatigue.  Gastrointestinal: Negative for nausea, vomiting and abdominal pain.   Physical Exam   Blood pressure 129/77, pulse 67, temperature 98.6 F (37 C), temperature source Oral, resp. rate 18, last menstrual period 05/19/2014, unknown if currently breastfeeding.  Physical Exam  Constitutional: She is oriented to person, place, and time. She appears well-developed and well-nourished. No distress.   Cardiovascular: Normal rate.   Respiratory: Effort normal.  Genitourinary:  Exam not indicated   Musculoskeletal: Normal range of motion.  Neurological: She is alert and oriented to person, place, and time.  Skin: Skin is warm and dry.  Psychiatric: She has a normal mood and affect.    MAU Course  Procedures  MDM Results for orders placed during the hospital encounter of 06/15/14 (from the past 24 hour(s))  HCG, QUANTITATIVE, PREGNANCY     Status: Abnormal   Collection Time    06/15/14  9:54 AM      Result Value Ref Range   hCG, Beta Chain, Quant, S 38 (*) <5 mIU/mL   Results for Jari FavreROSS, Jailee J (MRN [id number]) as of 06/15/2014 10:48  Ref. Range 06/13/2014 12:14  hCG, Beta Chain, Quant, S Latest Range: <5 mIU/mL 43 (H)    Assessment and Plan  A:  SAB P:  Discussed findings       [facility] Quant HCG  [facility] LLC[facility]ILLIAMS,Dorotea Hand 06/15/2014, 10:49 AM

## 2014-06-15 NOTE — MAU Note (Signed)
Feeling better, the bad cramping and heavier bleeding has stopped.  occ lightheaded, but not as bad, still some nausea.

## 2014-06-15 NOTE — Discharge Instructions (Signed)
Miscarriage A miscarriage is the sudden loss of an unborn baby (fetus) before the 20th week of pregnancy. Most miscarriages happen in the first 3 months of pregnancy. Sometimes, it happens before a woman even knows she is pregnant. A miscarriage is also called a "spontaneous miscarriage" or "early pregnancy loss." Having a miscarriage can be an emotional experience. Talk with your caregiver about any questions you may have about miscarrying, the grieving process, and your future pregnancy plans. CAUSES   Problems with the fetal chromosomes that make it impossible for the baby to develop normally. Problems with the baby's genes or chromosomes are most often the result of errors that occur, by chance, as the embryo divides and grows. The problems are not inherited from the parents.  Infection of the cervix or uterus.   Hormone problems.   Problems with the cervix, such as having an incompetent cervix. This is when the tissue in the cervix is not strong enough to hold the pregnancy.   Problems with the uterus, such as an abnormally shaped uterus, uterine fibroids, or congenital abnormalities.   Certain medical conditions.   Smoking, drinking alcohol, or taking illegal drugs.   Trauma.  Often, the cause of a miscarriage is unknown.  SYMPTOMS   Vaginal bleeding or spotting, with or without cramps or pain.  Pain or cramping in the abdomen or lower back.  Passing fluid, tissue, or blood clots from the vagina. DIAGNOSIS  Your caregiver will perform a physical exam. You may also have an ultrasound to confirm the miscarriage. Blood or urine tests may also be ordered. TREATMENT   Sometimes, treatment is not necessary if you naturally pass all the fetal tissue that was in the uterus. If some of the fetus or placenta remains in the body (incomplete miscarriage), tissue left behind may become infected and must be removed. Usually, a dilation and curettage (D and C) procedure is performed.  During a D and C procedure, the cervix is widened (dilated) and any remaining fetal or placental tissue is gently removed from the uterus.  Antibiotic medicines are prescribed if there is an infection. Other medicines may be given to reduce the size of the uterus (contract) if there is a lot of bleeding.  If you have Rh negative blood and your baby was Rh positive, you will need a Rh immunoglobulin shot. This shot will protect any future baby from having Rh blood problems in future pregnancies. HOME CARE INSTRUCTIONS   Your caregiver may order bed rest or may allow you to continue light activity. Resume activity as directed by your caregiver.  Have someone help with home and family responsibilities during this time.   Keep track of the number of sanitary pads you use each day and how soaked (saturated) they are. Write down this information.   Do not use tampons. Do not douche or have sexual intercourse until approved by your caregiver.   Only take over-the-counter or prescription medicines for pain or discomfort as directed by your caregiver.   Do not take aspirin. Aspirin can cause bleeding.   Keep all follow-up appointments with your caregiver.   If you or your partner have problems with grieving, talk to your caregiver or seek counseling to help cope with the pregnancy loss. Allow enough time to grieve before trying to get pregnant again.  SEEK IMMEDIATE MEDICAL CARE IF:   You have severe cramps or pain in your back or abdomen.  You have a fever.  You pass large blood clots (walnut-sized   or larger) ortissue from your vagina. Save any tissue for your caregiver to inspect.   Your bleeding increases.   You have a thick, bad-smelling vaginal discharge.  You become lightheaded, weak, or you faint.   You have chills.  MAKE SURE YOU:  Understand these instructions.  Will watch your condition.  Will get help right away if you are not doing well or get  worse. Document Released: 05/20/2001 Document Revised: 03/21/2013 Document Reviewed: 01/13/2012 ExitCare Patient Information 2015 [employer]. This information is not intended to replace advice given to you by your health care provider. Make sure you discuss any questions you have with your health care provider.  

## 2014-06-21 ENCOUNTER — Other Ambulatory Visit: Payer: Medicaid Other

## 2014-06-22 LAB — HCG, QUANTITATIVE, PREGNANCY: hCG, Beta Chain, Quant, S: 2.8 m[IU]/mL

## 2014-10-09 ENCOUNTER — Encounter (HOSPITAL_COMMUNITY): Payer: Self-pay | Admitting: *Deleted

## 2014-12-04 ENCOUNTER — Encounter: Payer: Self-pay | Admitting: *Deleted

## 2014-12-05 ENCOUNTER — Encounter: Payer: Self-pay | Admitting: Obstetrics & Gynecology

## 2015-04-20 ENCOUNTER — Encounter (HOSPITAL_COMMUNITY): Payer: Self-pay | Admitting: *Deleted

## 2015-07-22 ENCOUNTER — Encounter (HOSPITAL_BASED_OUTPATIENT_CLINIC_OR_DEPARTMENT_OTHER): Payer: Self-pay | Admitting: *Deleted

## 2015-07-22 ENCOUNTER — Emergency Department (HOSPITAL_BASED_OUTPATIENT_CLINIC_OR_DEPARTMENT_OTHER)
Admission: EM | Admit: 2015-07-22 | Discharge: 2015-07-22 | Disposition: A | Discharge status: Home / Self Care | Payer: Medicaid Other | Attending: Emergency Medicine | Admitting: Emergency Medicine

## 2015-07-22 DIAGNOSIS — K029 Dental caries, unspecified: Secondary | ICD-10-CM | POA: Insufficient documentation

## 2015-07-22 DIAGNOSIS — Z792 Long term (current) use of antibiotics: Secondary | ICD-10-CM | POA: Diagnosis not present

## 2015-07-22 DIAGNOSIS — Z720 Tobacco use: Secondary | ICD-10-CM | POA: Diagnosis not present

## 2015-07-22 DIAGNOSIS — Z8632 Personal history of gestational diabetes: Secondary | ICD-10-CM | POA: Diagnosis not present

## 2015-07-22 DIAGNOSIS — K088 Other specified disorders of teeth and supporting structures: Secondary | ICD-10-CM | POA: Diagnosis present

## 2015-07-22 MED ORDER — KETOROLAC TROMETHAMINE 60 MG/2ML IM SOLN
60.0000 mg | Freq: Once | INTRAMUSCULAR | Status: AC
  Administered 2015-07-22: 60 mg via INTRAMUSCULAR
  Filled 2015-07-22: qty 2

## 2015-07-22 MED ORDER — CLINDAMYCIN HCL 150 MG PO CAPS
300.0000 mg | ORAL_CAPSULE | Freq: Once | ORAL | Status: AC
  Administered 2015-07-22: 300 mg via ORAL
  Filled 2015-07-22: qty 2

## 2015-07-22 MED ORDER — MELOXICAM 15 MG PO TABS
15.0000 mg | ORAL_TABLET | Freq: Every day | ORAL | Status: DC
Start: 2015-07-22 — End: 2016-01-13

## 2015-07-22 MED ORDER — CLINDAMYCIN HCL 300 MG PO CAPS: 300.0000 mg | ORAL_CAPSULE | Freq: Four times a day (QID) | ORAL | Status: DC

## 2015-07-22 NOTE — Discharge Instructions (Signed)
Dental Care and Dentist Visits  Dental care supports good overall health. Regular dental visits can also help you avoid dental pain, bleeding, infection, and other more serious health problems in the future. It is important to keep the mouth healthy because diseases in the teeth, gums, and other oral tissues can spread to other areas of the body. Some problems, such as diabetes, heart disease, and pre-term labor have been associated with poor oral health.   See your dentist every 6 months. If you experience emergency problems such as a toothache or broken tooth, go to the dentist right away. If you see your dentist regularly, you may catch problems early. It is easier to be treated for problems in the early stages.   WHAT TO EXPECT AT A DENTIST VISIT   Your dentist will look for many common oral health problems and recommend proper treatment. At your regular dental visit, you can expect:  · Gentle cleaning of the teeth and gums. This includes scraping and polishing. This helps to remove the sticky substance around the teeth and gums (plaque). Plaque forms in the mouth shortly after eating. Over time, plaque hardens on the teeth as tartar. If tartar is not removed regularly, it can cause problems. Cleaning also helps remove stains.  · Periodic X-rays. These pictures of the teeth and supporting bone will help your dentist assess the health of your teeth.  · Periodic fluoride treatments. Fluoride is a natural mineral shown to help strengthen teeth. Fluoride treatment involves applying a fluoride gel or varnish to the teeth. It is most commonly done in children.  · Examination of the mouth, tongue, jaws, teeth, and gums to look for any oral health problems, such as:  ¨ Cavities (dental caries). This is decay on the tooth caused by plaque, sugar, and acid in the mouth. It is best to catch a cavity when it is small.  ¨ Inflammation of the gums caused by plaque buildup (gingivitis).  ¨ Problems with the mouth or malformed  or misaligned teeth.  ¨ Oral cancer or other diseases of the soft tissues or jaws.   KEEP YOUR TEETH AND GUMS HEALTHY  For healthy teeth and gums, follow these general guidelines as well as your dentist's specific advice:  · Have your teeth professionally cleaned at the dentist every 6 months.  · Brush twice daily with a fluoride toothpaste.  · Floss your teeth daily.   · Ask your dentist if you need fluoride supplements, treatments, or fluoride toothpaste.  · Eat a healthy diet. Reduce foods and drinks with added sugar.  · Avoid smoking.  TREATMENT FOR ORAL HEALTH PROBLEMS  If you have oral health problems, treatment varies depending on the conditions present in your teeth and gums.  · Your caregiver will most likely recommend good oral hygiene at each visit.  · For cavities, gingivitis, or other oral health disease, your caregiver will perform a procedure to treat the problem. This is typically done at a separate appointment. Sometimes your caregiver will refer you to another dental specialist for specific tooth problems or for surgery.  SEEK IMMEDIATE DENTAL CARE IF:  · You have pain, bleeding, or soreness in the gum, tooth, jaw, or mouth area.  · A permanent tooth becomes loose or separated from the gum socket.  · You experience a blow or injury to the mouth or jaw area.  Document Released: 08/06/2011 Document Revised: 02/16/2012 Document Reviewed: 08/06/2011  ExitCare® Patient Information ©2015 [employer]. This information is not intended to replace advice   given to you by your health care provider. Make sure you discuss any questions you have with your health care provider.

## 2015-07-22 NOTE — ED Provider Notes (Signed)
CSN: [id number]     Arrival date & time 07/22/15  0054 History   First MD Initiated Contact with Patient 07/22/15 0127     Chief Complaint  Patient presents with  . Dental Pain     (Consider location/radiation/quality/duration/timing/severity/associated sxs/prior Treatment) Patient is a 33 y.o. female presenting with tooth pain. The history is provided by the patient.  Dental Pain Location:  Upper Upper teeth location:  3/RU 1st molar Quality:  Dull Severity:  Severe Onset quality:  Gradual Timing:  Constant Progression:  Worsening Chronicity:  Chronic Context: dental caries   Previous work-up:  Dental exam and filled cavity Relieved by:  Nothing Worsened by:  Nothing tried Ineffective treatments:  Acetaminophen Associated symptoms: no congestion, no facial swelling and no neck swelling   Risk factors: smoking     Past Medical History  Diagnosis Date  . Headache(784.0)   . Gestational diabetes     with g2 and g3  . Pregnancy induced hypertension    History reviewed. No pertinent past surgical history. Family History  Problem Relation Age of Onset  . Ovarian cysts Sister    Social History  Substance Use Topics  . Smoking status: Light Tobacco Smoker  . Smokeless tobacco: None  . Alcohol Use: No     Comment: occasion   OB History    Gravida Para Term Preterm AB TAB SAB Ectopic Multiple Living   Review of Systems  HENT: Negative for congestion and facial swelling.   All other systems reviewed and are negative.     Allergies  Benadryl  Home Medications   Prior to Admission medications   Medication Sig Start Date End Date Taking? Authorizing Provider  amoxicillin (AMOXIL) 500 MG capsule Take 500 mg by mouth 3 (three) times daily.   Yes Historical Provider, MD  cephALEXin (KEFLEX) 500 MG capsule Take 1 capsule (500 mg total) by mouth 2 (two) times daily. 06/13/14   Marlis Edelson, CNM   BP 138/87 mmHg  Pulse 71  Temp(Src) 98.7 F  (37.1 C)  Resp 18  Ht  (1.702 m)  Wt 210 lb (95.255 kg)  BMI 32.88 kg/m2  SpO2 100%  LMP 06/22/2015 (Approximate) Physical Exam  Constitutional: She is oriented to person, place, and time. She appears well-developed and well-nourished.  HENT:  Head: Normocephalic and atraumatic.  Mouth/Throat: Oropharynx is clear and moist.    Eyes: Conjunctivae are normal. Pupils are equal, round, and reactive to light.  Neck: Normal range of motion. Neck supple.  Cardiovascular: Normal rate, regular rhythm and normal heart sounds.   Pulmonary/Chest: Effort normal and breath sounds normal. She has no wheezes. She has no rales.  Abdominal: Soft. Bowel sounds are normal. There is no tenderness.  Musculoskeletal: Normal range of motion.  Neurological: She is alert and oriented to person, place, and time.  Skin: Skin is warm and dry.  Psychiatric: She has a normal mood and affect.    ED Course  Procedures (including critical care time) Labs Review Labs Reviewed - No data to display  Imaging Review No results found. I, Zebulon Gantt-RASCH,Delva Derden K, personally reviewed and evaluated these images and lab results as part of my medical decision-making.   EKG Interpretation None      MDM   Final diagnoses:  None    Will change to clindamycin and add mobic.  Follow up with your dentist    Dontarius Sheley, MD 07/22/15  0220 

## 2015-07-22 NOTE — ED Notes (Signed)
C/o right upper tooth pain that started about one week ago. Denies any fevers. Has tried ibuprofen without relief around 230pm and also tylenol around 2230. Has been taking amoxicillin since Thursday. Pt has seen a dentist and the plan is to get the tooth taken on Saturday.

## 2015-12-09 HISTORY — PX: TOOTH EXTRACTION: SUR596

## 2016-01-13 ENCOUNTER — Encounter (HOSPITAL_BASED_OUTPATIENT_CLINIC_OR_DEPARTMENT_OTHER): Payer: Self-pay | Admitting: *Deleted

## 2016-01-13 ENCOUNTER — Emergency Department (HOSPITAL_BASED_OUTPATIENT_CLINIC_OR_DEPARTMENT_OTHER)
Admission: EM | Admit: 2016-01-13 | Discharge: 2016-01-13 | Disposition: A | Discharge status: Home / Self Care | Payer: Medicaid Other | Attending: Emergency Medicine | Admitting: Emergency Medicine

## 2016-01-13 ENCOUNTER — Emergency Department (HOSPITAL_BASED_OUTPATIENT_CLINIC_OR_DEPARTMENT_OTHER): Payer: Medicaid Other

## 2016-01-13 DIAGNOSIS — R0789 Other chest pain: Secondary | ICD-10-CM

## 2016-01-13 DIAGNOSIS — J069 Acute upper respiratory infection, unspecified: Secondary | ICD-10-CM | POA: Diagnosis not present

## 2016-01-13 DIAGNOSIS — O24419 Gestational diabetes mellitus in pregnancy, unspecified control: Secondary | ICD-10-CM | POA: Diagnosis not present

## 2016-01-13 DIAGNOSIS — F17200 Nicotine dependence, unspecified, uncomplicated: Secondary | ICD-10-CM | POA: Insufficient documentation

## 2016-01-13 DIAGNOSIS — R0981 Nasal congestion: Secondary | ICD-10-CM | POA: Diagnosis present

## 2016-01-13 LAB — TROPONIN I: Troponin I: <0.03 ng/mL (ref <0.031)

## 2016-01-13 LAB — D-DIMER, QUANTITATIVE: D-Dimer, Quant: <0.27 ug/mL-FEU (ref 0.00–0.50)

## 2016-01-13 MED ORDER — IBUPROFEN 800 MG PO TABS
800.0000 mg | ORAL_TABLET | Freq: Once | ORAL | Status: AC
  Administered 2016-01-13: 800 mg via ORAL
  Filled 2016-01-13: qty 1

## 2016-01-13 NOTE — ED Provider Notes (Signed)
CSN: [id number]     Arrival date & time 01/13/16  1425 History   First MD Initiated Contact with Patient 01/13/16 1706     Chief Complaint  Patient presents with  . URI     (Consider location/radiation/quality/duration/timing/severity/associated sxs/prior Treatment) Patient is a 35 y.o. female presenting with URI. The history is provided by the patient.  URI Presenting symptoms: congestion   Severity:  Mild Onset quality:  Gradual Duration:  2 days Timing:  Constant Progression:  Worsening Chronicity:  New Relieved by:  Nothing Worsened by:  Nothing tried Ineffective treatments:  Decongestant Associated symptoms: myalgias    ELLISSA AYO is a 35 y.o. female who presents to the ED with congestion that started yesterday. She also reports that she has chest pain that started suddenly yesterday when she was getting out of her car. She reports feeling a sharp pain on the left side of her chest that went under her breast. The pain was worse with movement and has mostly resolved since then. Today she has noted a pain in the mid chest with mild shortness of breath. The pain is worse with deep breath and movement.   Past Medical History  Diagnosis Date  . Headache(784.0)   . Gestational diabetes     with g2 and g3  . Pregnancy induced hypertension    History reviewed. No pertinent past surgical history. Family History  Problem Relation Age of Onset  . Ovarian cysts Sister    Social History  Substance Use Topics  . Smoking status: Light Tobacco Smoker  . Smokeless tobacco: None  . Alcohol Use: No     Comment: occasion   OB History    Gravida Para Term Preterm AB TAB SAB Ectopic Multiple Living   Review of Systems  HENT: Positive for congestion.   Respiratory: Positive for shortness of breath.   Cardiovascular: Positive for chest pain. Negative for palpitations and leg swelling.  Musculoskeletal: Positive for myalgias.  all other systems  negtive    Allergies  Benadryl  Home Medications   Prior to Admission medications   Not on File   BP 132/89 mmHg  Pulse 62  Temp(Src) 98.3 F (36.8 C) (Oral)  Resp 20  Ht  (1.702 m)  Wt 99.791 kg  BMI 34.45 kg/m2  SpO2 100%  LMP 12/30/2015 Physical Exam  Constitutional: She is oriented to person, place, and time. She appears well-developed and well-nourished. No distress.  HENT:  Head: Normocephalic and atraumatic.  Right Ear: Tympanic membrane normal.  Left Ear: Tympanic membrane normal.  Nose: Mucosal edema and rhinorrhea present.  Eyes: EOM are normal.  Neck: Normal range of motion. Neck supple.  Cardiovascular: Normal rate and regular rhythm.   Pulmonary/Chest: Effort normal. She has no wheezes. She has no rales. She exhibits tenderness.  Abdominal: Soft. Bowel sounds are normal. There is no tenderness.  Musculoskeletal: Normal range of motion.  Neurological: She is alert and oriented to person, place, and time. No cranial nerve deficit.  Skin: Skin is warm and dry.  Psychiatric: She has a normal mood and affect. Her behavior is normal.  Nursing note and vitals reviewed.   ED Course  Procedures (including critical care time) Labs Review Labs Reviewed  D-[facility]IM[facility], QUANTITATIVE ([id number])  TROPONIN I    Imaging Review Dg Chest 2 View  01/13/2016  CLINICAL DATA:  Shortness of breath, chest pain, headache EXAM: CHEST  2 VIEW COMPARISON:  None. FINDINGS: The heart size and mediastinal contours are within normal limits. Both lungs are clear. The visualized skeletal structures are unremarkable. IMPRESSION: No active cardiopulmonary disease. Electronically Signed   By: Elige Ko   On: 01/13/2016 15:10   I have personally reviewed and evaluated these images and lab results as part of my medical decision-making.  EKG reviewed by Dr. Rubin Payor.  Normal sinus rhythm with sinus arrhythmia Low voltage QRS Nonspecific T wave abnormality ( he was unable to put in  MUSE due to problem with the EKG transferring over)  MDM  35 y.o. female with congestion and chest wall pain stable for d/c with normal D-dimer and Troponin I, normal CXR. O2 SAT 100% on R/A. Discussed with the patient and all questioned fully answered. She will return if any problems arise.   Final diagnoses:  Chest wall pain  URI (upper respiratory infection)      Janne Napoleon, NP 01/14/16 [id number] [place], NP 01/14/16 0113  Benjiman Core, MD 01/15/16 2200

## 2016-01-13 NOTE — Discharge Instructions (Signed)
Your blood work, EKG and chest x-ray are all normal. If your symptoms worsen or you have problems return.   Chest Wall Pain Chest wall pain is pain in or around the bones and muscles of your chest. Sometimes, an injury causes this pain. Sometimes, the cause may not be known. This pain may take several weeks or longer to get better. HOME CARE INSTRUCTIONS  Pay attention to any changes in your symptoms. Take these actions to help with your pain:   Rest as told by your health care provider.   Avoid activities that cause pain. These include any activities that use your chest muscles or your abdominal and side muscles to lift heavy items.   If directed, apply ice to the painful area:  Put ice in a plastic bag.  Place a towel between your skin and the bag.  Leave the ice on for 20 minutes, 2-3 times per day.  Take over-the-counter and prescription medicines only as told by your health care provider.  Do not use tobacco products, including cigarettes, chewing tobacco, and e-cigarettes. If you need help quitting, ask your health care provider.  Keep all follow-up visits as told by your health care provider. This is important. SEEK MEDICAL CARE IF:  You have a fever.  Your chest pain becomes worse.  You have new symptoms. SEEK IMMEDIATE MEDICAL CARE IF:  You have nausea or vomiting.  You feel sweaty or light-headed.  You have a cough with phlegm (sputum) or you cough up blood.  You develop shortness of breath.   This information is not intended to replace advice given to you by your health care provider. Make sure you discuss any questions you have with your health care provider.   Document Released: 11/24/2005 Document Revised: 08/15/2015 Document Reviewed: 02/19/2015 Elsevier Interactive Patient Education 2016 [employer].  [employer] Vaporizers Vaporizers may help relieve the symptoms of a cough and cold. They add moisture to the air, which helps mucus to become thinner  and less sticky. This makes it easier to breathe and cough up secretions. Cool mist vaporizers do not cause serious burns like hot mist vaporizers, which may also be called steamers or humidifiers. Vaporizers have not been proven to help with colds. You should not use a vaporizer if you are allergic to mold. HOME CARE INSTRUCTIONS  Follow the package instructions for the vaporizer.  Do not use anything other than distilled water in the vaporizer.  Do not run the vaporizer all of the time. This can cause mold or bacteria to grow in the vaporizer.  Clean the vaporizer after each time it is used.  Clean and dry the vaporizer well before storing it.  Stop using the vaporizer if worsening respiratory symptoms develop.   This information is not intended to replace advice given to you by your health care provider. Make sure you discuss any questions you have with your health care provider.   Document Released: 08/21/2004 Document Revised: 11/29/2013 Document Reviewed: 04/13/2013 Elsevier Interactive Patient Education 2016 Else[employer] Inc[employer]  Upper Respiratory Infection, Adult Most upper respiratory infections (URIs) are caused by a virus. A URI affects the nose, throat, and upper air passages. The most common type of URI is often called "the common cold." HOME CARE   Take medicines only as told by your doctor.  Gargle warm saltwater or take cough drops to comfort your throat as told by your doctor.  Use a warm mist humidifier or inhale steam from a shower to increase air  moisture. This may make it easier to breathe.  Drink enough fluid to keep your pee (urine) clear or pale yellow.  Eat soups and other clear broths.  Have a healthy diet.  Rest as needed.  Go back to work when your fever is gone or your doctor says it is okay.  You may need to stay home longer to avoid giving your URI to others.  You can also wear a face mask and wash your hands often to prevent spread of the  virus.  Use your inhaler more if you have asthma.  Do not use any tobacco products, including cigarettes, chewing tobacco, or electronic cigarettes. If you need help quitting, ask your doctor. GET HELP IF:  You are getting worse, not better.  Your symptoms are not helped by medicine.  You have chills.  You are getting more short of breath.  You have brown or red mucus.  You have yellow or brown discharge from your nose.  You have pain in your face, especially when you bend forward.  You have a fever.  You have puffy (swollen) neck glands.  You have pain while swallowing.  You have white areas in the back of your throat. GET HELP RIGHT AWAY IF:   You have very bad or constant:  Headache.  Ear pain.  Pain in your forehead, behind your eyes, and over your cheekbones (sinus pain).  Chest pain.  You have long-lasting (chronic) lung disease and any of the following:  Wheezing.  Long-lasting cough.  Coughing up blood.  A change in your usual mucus.  You have a stiff neck.  You have changes in your:  Vision.  Hearing.  Thinking.  Mood. MAKE SURE YOU:   Understand these instructions.  Will watch your condition.  Will get help right away if you are not doing well or get worse.   This information is not intended to replace advice given to you by your health care provider. Make sure you discuss any questions you have with your health care provider.   Document Released: 05/12/2008 Document Revised: 04/10/2015 Document Reviewed: 03/01/2014 Elsevier Interactive Patient Education [employer]

## 2016-01-13 NOTE — ED Notes (Signed)
Pt reports URI with chest pain.  Substernal CP, worsening with movement and breathing.  Denies cough.

## 2017-01-29 ENCOUNTER — Ambulatory Visit (HOSPITAL_COMMUNITY)
Admission: EM | Admit: 2017-01-29 | Discharge: 2017-01-29 | Disposition: A | Discharge status: Home / Self Care | Payer: Medicaid Other | Attending: Family Medicine | Admitting: Family Medicine

## 2017-01-29 ENCOUNTER — Encounter (HOSPITAL_COMMUNITY): Payer: Self-pay | Admitting: Emergency Medicine

## 2017-01-29 DIAGNOSIS — R059 Cough, unspecified: Secondary | ICD-10-CM

## 2017-01-29 DIAGNOSIS — R05 Cough: Secondary | ICD-10-CM

## 2017-01-29 DIAGNOSIS — J40 Bronchitis, not specified as acute or chronic: Secondary | ICD-10-CM

## 2017-01-29 MED ORDER — ALBUTEROL SULFATE HFA 108 (90 BASE) MCG/ACT IN AERS: 1.0000 | INHALATION_SPRAY | Freq: Four times a day (QID) | RESPIRATORY_TRACT | 0 refills | Status: DC | PRN

## 2017-01-29 MED ORDER — METHYLPREDNISOLONE SODIUM SUCC 125 MG IJ SOLR
INTRAMUSCULAR | Status: AC
  Filled 2017-01-29: qty 2

## 2017-01-29 MED ORDER — METHYLPREDNISOLONE 4 MG PO TBPK: ORAL_TABLET | ORAL | 0 refills | Status: DC

## 2017-01-29 MED ORDER — METHYLPREDNISOLONE SODIUM SUCC 125 MG IJ SOLR
125.0000 mg | Freq: Once | INTRAMUSCULAR | Status: AC
  Administered 2017-01-29: 125 mg via INTRAMUSCULAR

## 2017-01-29 MED ORDER — GUAIFENESIN-CODEINE 100-10 MG/5ML PO SYRP: 5.0000 mL | ORAL_SOLUTION | Freq: Three times a day (TID) | ORAL | 0 refills | Status: DC | PRN

## 2017-01-29 MED ORDER — AMOXICILLIN 875 MG PO TABS: 875.0000 mg | ORAL_TABLET | Freq: Two times a day (BID) | ORAL | 0 refills | Status: DC

## 2017-01-29 MED ORDER — ALBUTEROL SULFATE (2.5 MG/3ML) 0.083% IN NEBU
INHALATION_SOLUTION | RESPIRATORY_TRACT | Status: AC
  Filled 2017-01-29: qty 3

## 2017-01-29 MED ORDER — ALBUTEROL SULFATE (2.5 MG/3ML) 0.083% IN NEBU
2.5000 mg | INHALATION_SOLUTION | Freq: Once | RESPIRATORY_TRACT | Status: AC
  Administered 2017-01-29: 2.5 mg via RESPIRATORY_TRACT

## 2017-01-29 NOTE — ED Provider Notes (Signed)
CSN: [id number]     Arrival date & time 01/29/17  1950 History   First MD Initiated Contact with Patient 01/29/17 2008     Chief Complaint  Patient presents with  . URI   (Consider location/radiation/quality/duration/timing/severity/associated sxs/prior Treatment) Patient c/o cough and uri sx'sfor last few days   The history is provided by the patient.  URI  Presenting symptoms: congestion and cough   Severity:  Moderate Onset quality:  Sudden Duration:  5 days Timing:  Constant Relieved by:  Nothing Worsened by:  Nothing   Past Medical History:  Diagnosis Date  . Gestational diabetes    with g2 and g3  . Headache(784.0)   . Pregnancy induced hypertension    History reviewed. No pertinent surgical history. Family History  Problem Relation Age of Onset  . Ovarian cysts Sister    Social History  Substance Use Topics  . Smoking status: Light Tobacco Smoker    Types: Cigars  . Smokeless tobacco: Never Used  . Alcohol use No     Comment: occasion   OB History    Gravida Para Term Preterm AB Living   5 3 3   1 3    SAB TAB Ectopic Multiple Live Births       1   1     Review of Systems  Constitutional: Negative.   HENT: Positive for congestion.   Eyes: Negative.   Respiratory: Positive for cough.   Cardiovascular: Negative.   Gastrointestinal: Negative.   Endocrine: Negative.   Genitourinary: Negative.   Musculoskeletal: Negative.   Allergic/Immunologic: Negative.   Neurological: Negative.   Hematological: Negative.   Psychiatric/Behavioral: Negative.     Allergies  Benadryl [diphenhydramine hcl]  Home Medications   Prior to Admission medications   Medication Sig Start Date End Date Taking? Authorizing Provider  albuterol (PROVENTIL HFA;VENTOLIN HFA) 108 (90 Base) MCG/ACT inhaler Inhale 1-2 puffs into the lungs every 6 (six) hours as needed for wheezing or shortness of breath. 01/29/17   Deatra Canter, FNP  amoxicillin (AMOXIL) 875 MG tablet Take 1  tablet (875 mg total) by mouth 2 (two) times daily. 01/29/17   Deatra Canter, FNP  guaiFENesin-codeine (CHERATUSSIN AC) 100-10 MG/5ML syrup Take 5 mLs by mouth 3 (three) times daily as needed for cough. 01/29/17   Deatra Canter, FNP  methylPREDNISolone (MEDROL DOSEPAK) 4 MG TBPK tablet Take 6-5-4-3-2-1 po qd 01/29/17   Deatra Canter, FNP   Meds Ordered and Administered this Visit   Medications  albuterol (PROVENTIL) (2.5 MG/3ML) 0.083% nebulizer solution 2.5 mg (2.5 mg Nebulization Given 01/29/17 2016)  methylPREDNISolone sodium succinate (SOLU-MEDROL) 125 mg/2 mL injection 125 mg (125 mg Intramuscular Given 01/29/17 2034)    BP 142/99 (BP Location: Right Arm)   Pulse 99   Temp 98.8 F (37.1 C) (Oral)   Resp 18   LMP 01/15/2017   SpO2 100%   Breastfeeding? No  No data found.   Physical Exam  Constitutional: She appears well-developed and well-nourished.  HENT:  Head: Normocephalic and atraumatic.  Right Ear: External ear normal.  Left Ear: External ear normal.  Mouth/Throat: Oropharynx is clear and moist.  Eyes: Conjunctivae and EOM are normal. Pupils are equal, round, and reactive to light.  Neck: Normal range of motion. Neck supple.  Cardiovascular: Normal rate, regular rhythm and normal heart sounds.   Pulmonary/Chest: Effort normal. She has wheezes.  Vitals reviewed.   Urgent Care Course     Procedures (including critical care time)  Labs Review Labs Reviewed - No data to display  Imaging Review No results found.   Visual Acuity Review  Right Eye Distance:   Left Eye Distance:   Bilateral Distance:    Right Eye Near:   Left Eye Near:    Bilateral Near:         MDM   1. Bronchitis   2. Cough    Amoxicillin 875mg  one po bid x 10 days #20 Medrol dose pack as directed Albuterol neb now Albuterol MDI Cheratussin cough medicine.      Deatra CanterWilliam J Oxford, FNP 01/29/17 2040

## 2017-01-29 NOTE — ED Triage Notes (Signed)
Here for cold sx onset 2 weeks associated w/prod cough, SOB, PND, fevers, chills  Trying some home remedies w/no relief.   A&O x4.. NAD

## 2017-03-21 IMAGING — DX DG CHEST 2V
2 series · 2 of 2 positions shown · non-contrast
Comparison: None.

CLINICAL DATA: Shortness of breath, chest pain, headache

EXAM:
CHEST  2 VIEW

[chest pa]
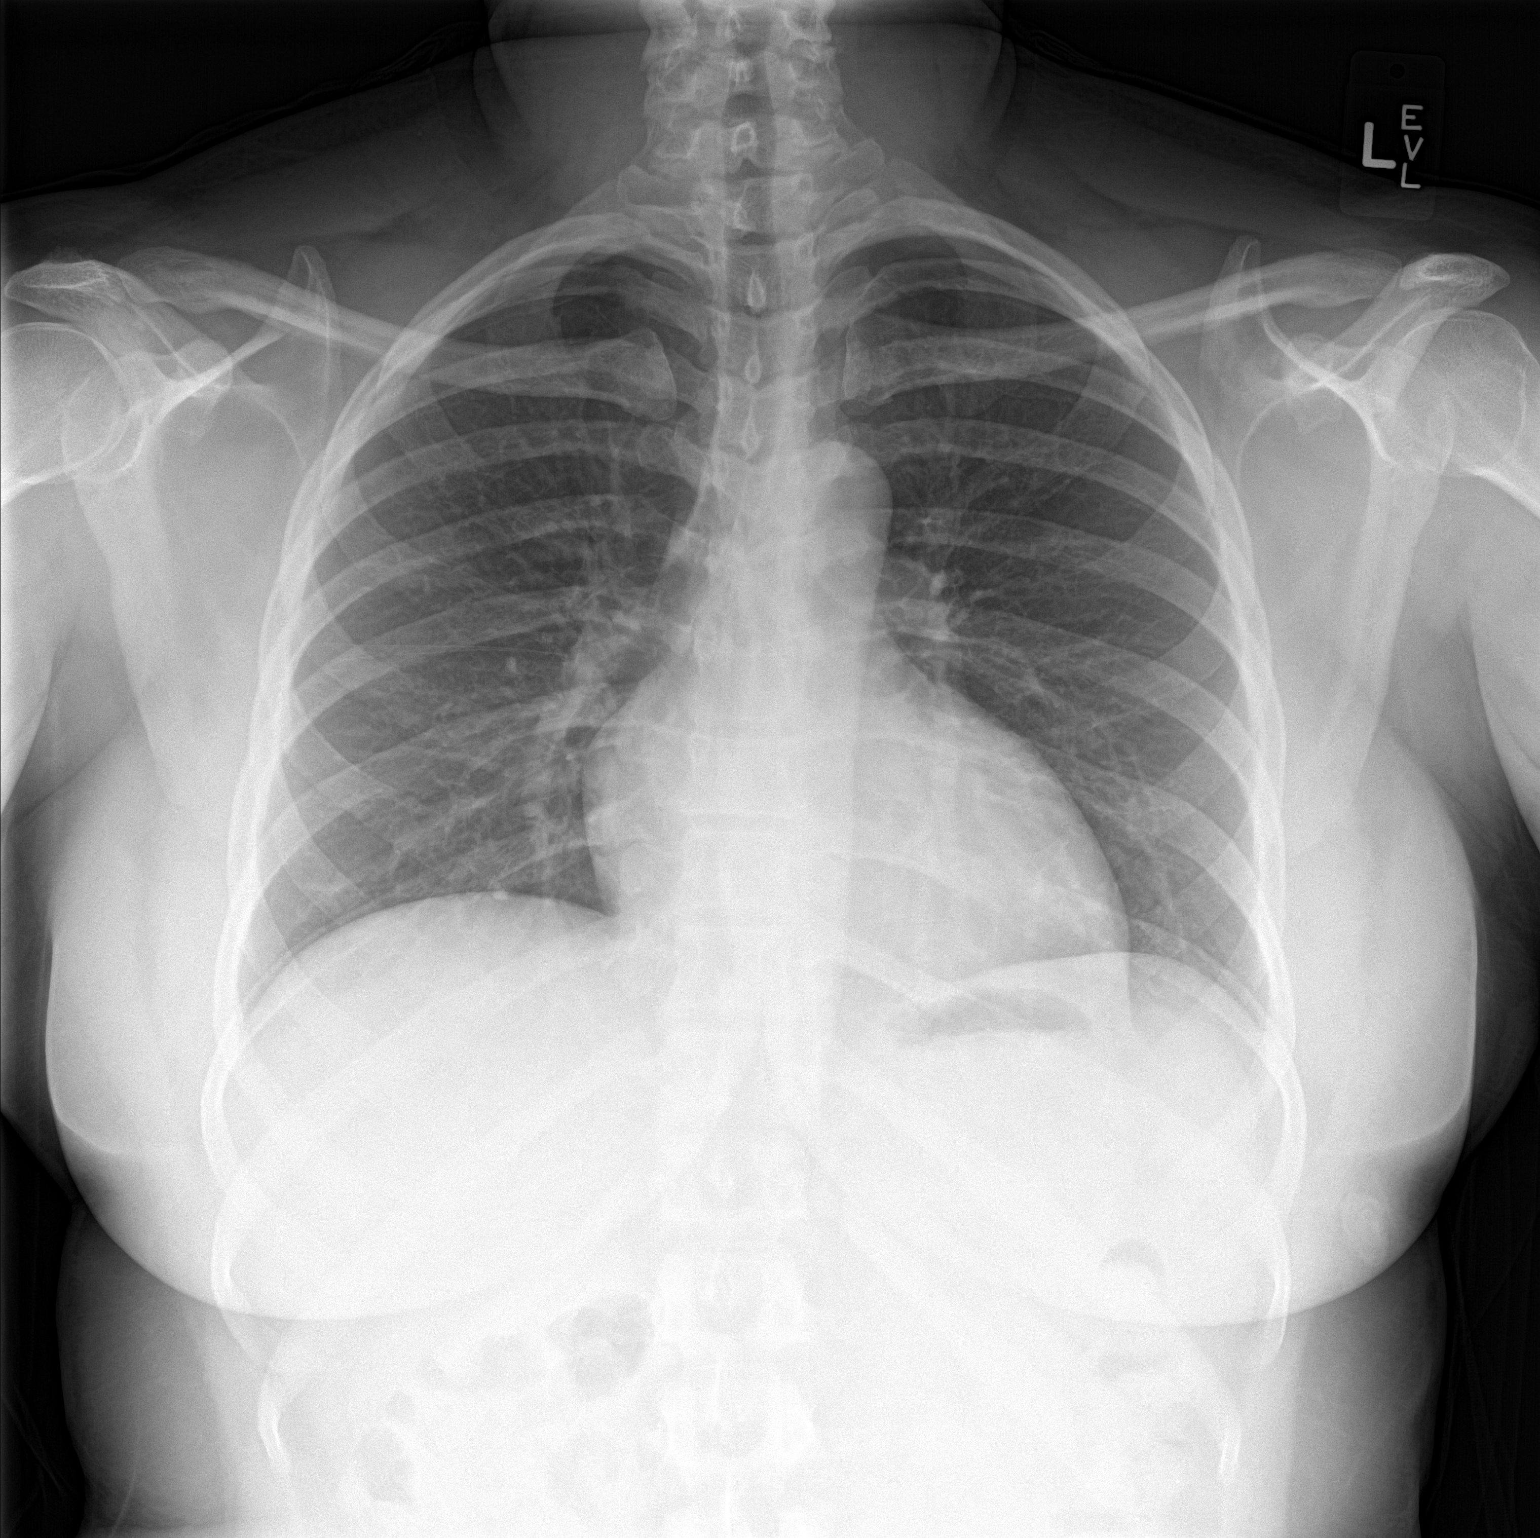

[chest lat]
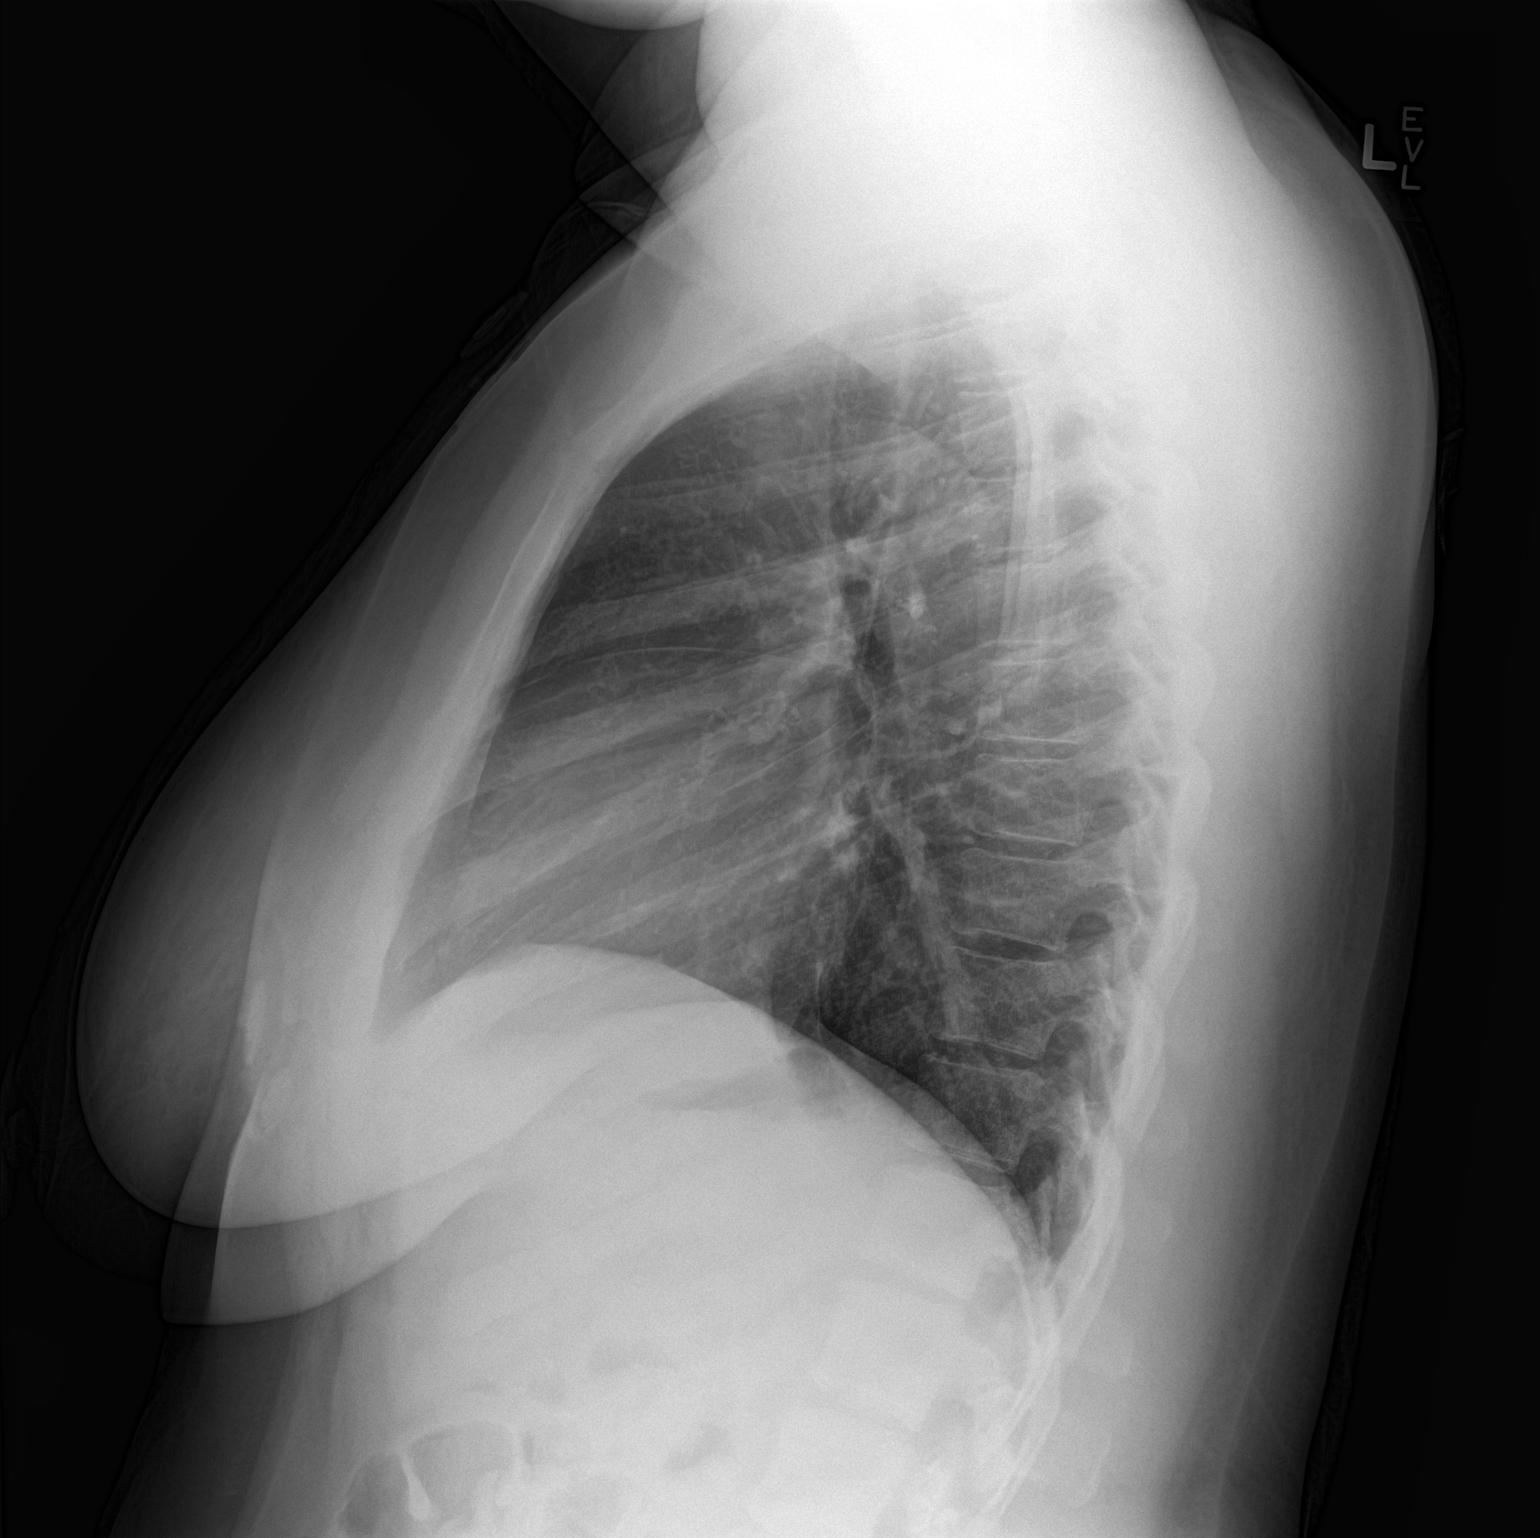

[2 of 2 positions shown; findings below may reference images not displayed]

FINDINGS: The heart size and mediastinal contours are within normal limits.
Both lungs are clear. The visualized skeletal structures are
unremarkable.
IMPRESSION: No active cardiopulmonary disease.

## 2017-11-10 ENCOUNTER — Encounter (HOSPITAL_BASED_OUTPATIENT_CLINIC_OR_DEPARTMENT_OTHER): Payer: Self-pay | Admitting: Emergency Medicine

## 2017-11-10 ENCOUNTER — Other Ambulatory Visit: Payer: Self-pay

## 2017-11-10 ENCOUNTER — Emergency Department (HOSPITAL_BASED_OUTPATIENT_CLINIC_OR_DEPARTMENT_OTHER)
Admission: EM | Admit: 2017-11-10 | Discharge: 2017-11-10 | Disposition: A | Discharge status: Home / Self Care | Payer: Self-pay | Attending: Emergency Medicine | Admitting: Emergency Medicine

## 2017-11-10 DIAGNOSIS — M542 Cervicalgia: Secondary | ICD-10-CM | POA: Insufficient documentation

## 2017-11-10 DIAGNOSIS — I10 Essential (primary) hypertension: Secondary | ICD-10-CM | POA: Insufficient documentation

## 2017-11-10 DIAGNOSIS — D649 Anemia, unspecified: Secondary | ICD-10-CM | POA: Insufficient documentation

## 2017-11-10 DIAGNOSIS — F17210 Nicotine dependence, cigarettes, uncomplicated: Secondary | ICD-10-CM | POA: Insufficient documentation

## 2017-11-10 DIAGNOSIS — K068 Other specified disorders of gingiva and edentulous alveolar ridge: Secondary | ICD-10-CM | POA: Insufficient documentation

## 2017-11-10 DIAGNOSIS — R0789 Other chest pain: Secondary | ICD-10-CM | POA: Insufficient documentation

## 2017-11-10 DIAGNOSIS — Z79899 Other long term (current) drug therapy: Secondary | ICD-10-CM | POA: Insufficient documentation

## 2017-11-10 DIAGNOSIS — R5383 Other fatigue: Secondary | ICD-10-CM | POA: Insufficient documentation

## 2017-11-10 LAB — CBC WITH DIFFERENTIAL/PLATELET
Basophils Absolute: 0 10*3/uL (ref 0.0–0.1)
Basophils Relative: 0 %
Eosinophils Absolute: 0.1 10*3/uL (ref 0.0–0.7)
Eosinophils Relative: 1 %
HCT: 33.9 % — ABNORMAL LOW (ref 36.0–46.0)
Hemoglobin: 10.8 g/dL — ABNORMAL LOW (ref 12.0–15.0)
Lymphocytes Relative: 41 %
Lymphs Abs: 2.3 10*3/uL (ref 0.7–4.0)
MCH: 24.4 pg — ABNORMAL LOW (ref 26.0–34.0)
MCHC: 31.9 g/dL (ref 30.0–36.0)
MCV: 76.5 fL — ABNORMAL LOW (ref 78.0–100.0)
Monocytes Absolute: 0.3 10*3/uL (ref 0.1–1.0)
Monocytes Relative: 6 %
Neutro Abs: 2.9 10*3/uL (ref 1.7–7.7)
Neutrophils Relative %: 52 %
Platelets: 241 10*3/uL (ref 150–400)
RBC: 4.43 MIL/uL (ref 3.87–5.11)
RDW: 16 % — ABNORMAL HIGH (ref 11.5–15.5)
WBC: 5.6 10*3/uL (ref 4.0–10.5)

## 2017-11-10 LAB — URINALYSIS, ROUTINE W REFLEX MICROSCOPIC
Bilirubin Urine: NEGATIVE
Glucose, UA: NEGATIVE mg/dL
Hgb urine dipstick: NEGATIVE
Ketones, ur: 40 mg/dL — AB
Leukocytes, UA: NEGATIVE
Nitrite: NEGATIVE
Protein, ur: NEGATIVE mg/dL
Specific Gravity, Urine: 1.015 (ref 1.005–1.030)
pH: 6.5 (ref 5.0–8.0)

## 2017-11-10 LAB — BASIC METABOLIC PANEL
Anion gap: 6 (ref 5–15)
BUN: 12 mg/dL (ref 6–20)
CO2: 26 mmol/L (ref 22–32)
Calcium: 8.8 mg/dL — ABNORMAL LOW (ref 8.9–10.3)
Chloride: 104 mmol/L (ref 101–111)
Creatinine, Ser: 0.7 mg/dL (ref 0.44–1.00)
GFR calc Af Amer: >60 mL/min (ref >60)
GFR calc non Af Amer: >60 mL/min (ref >60)
Glucose, Bld: 90 mg/dL (ref 65–99)
Potassium: 3.5 mmol/L (ref 3.5–5.1)
Sodium: 136 mmol/L (ref 135–145)

## 2017-11-10 MED ORDER — FERROUS SULFATE 325 (65 FE) MG PO TABS: 325.0000 mg | ORAL_TABLET | Freq: Every day | ORAL | 0 refills | Status: DC

## 2017-11-10 NOTE — Discharge Instructions (Signed)
Please take an iron supplement daily Please call Dr. Lenna SciaraFarless's office tomorrow to set up appointment Please establish care with primary doctor Return if worsening

## 2017-11-10 NOTE — ED Provider Notes (Signed)
[facility] EMERGENCY DEPARTMENT Provider Note   CSN: [id number] Arrival date & time: 11/10/17  1754     History   Chief Complaint Chief Complaint  Patient presents with  . Dental Pain  . Shoulder Pain    HPI Alejandra Patel is a 36 y.o. female who presents with a dental problem and neck and chest tightness. PMH significant for HTN (not currently on meds). She states that she has been having dental pain for the past year. She was going to make an appointment with a dentist but her dental insurance was cancelled. She states she has upper and lower gum line pain and there is a "white bump" on the inside of her lower teeth which she is unsure of what that is. She denies pain to the area but is concerned because she states it's "making her teeth shift". No fever, chills, facial swelling, difficulty swallowing.  Additionally she reports diffuse pain over the right side of her neck and chest. This started today while at work. It worried her so she came to the ED. She states that the pain is not really painful but more like a tightness. She states she does feel really stressed recently and feels "hazy" and fatigued. She denies current headache, chest pain, SOB, cough, abdominal pain, N/V/D, urinary symptoms. She does not currently have a primary doctor.  HPI  Past Medical History:  Diagnosis Date  . Gestational diabetes    with g2 and g3  . Headache(784.0)   . Pregnancy induced hypertension     Patient Active Problem List   Diagnosis Date Noted  . Abnormal uterine bleeding (AUB) 03/20/2014    History reviewed. No pertinent surgical history.  OB History    Gravida Para Term Preterm AB Living   5 3 3   1 3    SAB TAB Ectopic Multiple Live Births       1   1       Home Medications    Prior to Admission medications   Medication Sig Start Date End Date Taking? Authorizing Provider  albuterol (PROVENTIL HFA;VENTOLIN HFA) 108 (90 Base) MCG/ACT inhaler Inhale 1-2 puffs  into the lungs every 6 (six) hours as needed for wheezing or shortness of breath. 01/29/17   Deatra Canterxford, William J, FNP  amoxicillin (AMOXIL) 875 MG tablet Take 1 tablet (875 mg total) by mouth 2 (two) times daily. 01/29/17   Deatra Canterxford, William J, FNP  guaiFENesin-codeine (CHERATUSSIN AC) 100-10 MG/5ML syrup Take 5 mLs by mouth 3 (three) times daily as needed for cough. 01/29/17   Deatra Canterxford, William J, FNP  methylPREDNISolone (MEDROL DOSEPAK) 4 MG TBPK tablet Take 6-5-4-3-2-1 po qd 01/29/17   Deatra Canterxford, William J, FNP    Family History Family History  Problem Relation Age of Onset  . Ovarian cysts Sister     Social History Social History   Tobacco Use  . Smoking status: Light Tobacco Smoker    Types: Cigars  . Smokeless tobacco: Never Used  Substance Use Topics  . Alcohol use: No    Comment: occasion  . Drug use: No     Allergies   Benadryl [diphenhydramine hcl]   Review of Systems Review of Systems  Constitutional: Positive for fatigue. Negative for chills and fever.  HENT: Positive for dental problem. Negative for congestion and rhinorrhea.   Respiratory: Negative for cough and shortness of breath.   Cardiovascular: Positive for chest pain.  Gastrointestinal: Negative for abdominal pain, nausea and vomiting.  All other systems  reviewed and are negative.    Physical Exam Updated Vital Signs BP 119/70   Pulse 74   Temp 98.2 F (36.8 C) (Oral)   Resp (!) 24   Ht 5\' 7"  (1.702 m)   Wt 102.1 kg (225 lb)   LMP 09/21/2017   SpO2 99%   BMI 35.24 kg/m   Physical Exam  Constitutional: She is oriented to person, place, and time. She appears well-developed and well-nourished. No distress.  Well appearing. Wearing a crown on her head  HENT:  Head: Normocephalic and atraumatic.  Right Ear: Hearing, tympanic membrane, external ear and ear canal normal.  Left Ear: Hearing, tympanic membrane, external ear and ear canal normal.  Nose: Nose normal.  Mouth/Throat: Uvula is midline and  oropharynx is clear and moist. No dental abscesses or dental caries.  Eyes: Conjunctivae are normal. Pupils are equal, round, and reactive to light. Right eye exhibits no discharge. Left eye exhibits no discharge. No scleral icterus.  Neck: Normal range of motion.  Right sided trapezius tenderness  Cardiovascular: Normal rate and regular rhythm. Exam reveals no gallop and no friction rub.  No murmur heard. Pulmonary/Chest: Effort normal and breath sounds normal. No stridor. No respiratory distress. She has no wheezes. She has no rales. She exhibits no tenderness.  Abdominal: Soft. Bowel sounds are normal. She exhibits no distension. There is tenderness (mild suprapubic tenderness).  Neurological: She is alert and oriented to person, place, and time.  Skin: Skin is warm and dry.  Psychiatric: She has a normal mood and affect. Her behavior is normal.  Nursing note and vitals reviewed.    ED Treatments / Results  Labs (all labs ordered are listed, but only abnormal results are displayed) Labs Reviewed  BASIC METABOLIC PANEL - Abnormal; Notable for the following components:      Result Value   Calcium 8.8 (*)    All other components within normal limits  CBC WITH DIFFERENTIAL/PLATELET - Abnormal; Notable for the following components:   Hemoglobin 10.8 (*)    HCT 33.9 (*)    MCV 76.5 (*)    MCH 24.4 (*)    RDW 16.0 (*)    All other components within normal limits  URINALYSIS, ROUTINE W REFLEX MICROSCOPIC - Abnormal; Notable for the following components:   Ketones, ur 40 (*)    All other components within normal limits    EKG  EKG Interpretation None       Radiology No results found.  Procedures Procedures (including critical care time)  Medications Ordered in ED Medications - No data to display   Initial Impression / Assessment and Plan / ED Course  I have reviewed the triage vital signs and the nursing notes.  Pertinent labs & imaging results that were available  during my care of the patient were reviewed by me and considered in my medical decision making (see chart for details).  36 year old with dental/gum pain and atypical chest pain with neck pain. Unclear etiology. Vitals are normal. She is well appearing. She does have some mild trapezius tenderness on exam. Will obtain labs due to her vague/various complaints and EKG.  EKG is SR. CBC is remarkable for anemia. BMP is normal. UA is remarkable for ketones. Advised to increase fluid intake and will start her on an iron supplement. Discussed need for PCP follow up which she verbalized understanding. Also gave her a dental follow up with Dr. Leanord AsalFarless. Return precautions were given.  Final Clinical Impressions(s) / ED Diagnoses  Final diagnoses:  Anemia, unspecified type  Atypical chest pain  Pain in gums    ED Discharge Orders    None       Bethel Born, PA-C 11/11/17 1610    Arby Barrette, MD 11/12/17 [phone]

## 2017-11-10 NOTE — ED Triage Notes (Signed)
Patient states that she is having pain to her gums and mouth for the last year, the pain in her mouth started about 1 week ago  - the patient reports that today he started to have bilateral shoulder pain around to her chest . She reports that it has jumped around to different areas.

## 2018-05-17 ENCOUNTER — Emergency Department (HOSPITAL_COMMUNITY): Payer: BC Managed Care – PPO

## 2018-05-17 ENCOUNTER — Emergency Department (HOSPITAL_COMMUNITY)
Admission: EM | Admit: 2018-05-17 | Discharge: 2018-05-17 | Disposition: A | Discharge status: Home / Self Care | Payer: BC Managed Care – PPO | Attending: Emergency Medicine | Admitting: Emergency Medicine

## 2018-05-17 ENCOUNTER — Encounter (HOSPITAL_COMMUNITY): Payer: Self-pay

## 2018-05-17 ENCOUNTER — Other Ambulatory Visit: Payer: Self-pay

## 2018-05-17 DIAGNOSIS — Z79899 Other long term (current) drug therapy: Secondary | ICD-10-CM | POA: Diagnosis not present

## 2018-05-17 DIAGNOSIS — F17290 Nicotine dependence, other tobacco product, uncomplicated: Secondary | ICD-10-CM | POA: Insufficient documentation

## 2018-05-17 DIAGNOSIS — R202 Paresthesia of skin: Secondary | ICD-10-CM | POA: Insufficient documentation

## 2018-05-17 DIAGNOSIS — R0602 Shortness of breath: Secondary | ICD-10-CM | POA: Diagnosis not present

## 2018-05-17 DIAGNOSIS — R079 Chest pain, unspecified: Secondary | ICD-10-CM | POA: Insufficient documentation

## 2018-05-17 LAB — BASIC METABOLIC PANEL
Anion gap: 6 (ref 5–15)
BUN: 15 mg/dL (ref 6–20)
CO2: 25 mmol/L (ref 22–32)
Calcium: 9.1 mg/dL (ref 8.9–10.3)
Chloride: 107 mmol/L (ref 101–111)
Creatinine, Ser: 0.88 mg/dL (ref 0.44–1.00)
GFR calc Af Amer: >60 mL/min (ref >60)
GFR calc non Af Amer: >60 mL/min (ref >60)
Glucose, Bld: 104 mg/dL — ABNORMAL HIGH (ref 65–99)
Potassium: 4 mmol/L (ref 3.5–5.1)
Sodium: 138 mmol/L (ref 135–145)

## 2018-05-17 LAB — I-STAT BETA HCG BLOOD, ED (MC, WL, AP ONLY): I-stat hCG, quantitative: <5 m[IU]/mL (ref <5)

## 2018-05-17 LAB — CBC
HCT: 36.4 % (ref 36.0–46.0)
Hemoglobin: 11.2 g/dL — ABNORMAL LOW (ref 12.0–15.0)
MCH: 24.2 pg — ABNORMAL LOW (ref 26.0–34.0)
MCHC: 30.8 g/dL (ref 30.0–36.0)
MCV: 78.6 fL (ref 78.0–100.0)
Platelets: 216 10*3/uL (ref 150–400)
RBC: 4.63 MIL/uL (ref 3.87–5.11)
RDW: 14.5 % (ref 11.5–15.5)
WBC: 5.1 10*3/uL (ref 4.0–10.5)

## 2018-05-17 LAB — I-STAT TROPONIN, ED: Troponin i, poc: 0 ng/mL (ref 0.00–0.08)

## 2018-05-17 NOTE — ED Triage Notes (Signed)
Pt presents to ED with CP for 2-3 days.  Pt reports left sided CP that radiates to left arm neck and back.  Pt has no cardiac history and denies any strenuous activity.  Pt reports it was difficult to sleep last night as it was hard to lie flat.

## 2018-05-17 NOTE — Discharge Instructions (Addendum)
There is no indication of a heart attack, blood clot, or other emergent chest condition at this time.  However if you develop recurrent or worsening chest pain, shortness of breath, back or abdominal pain, or other new/concerning symptoms then return to the ER for evaluation.  Otherwise follow-up with a primary care physician.

## 2018-05-17 NOTE — ED Notes (Signed)
[facility] per Morrie SheldonAshley - RN. IV site was clean, dry and intact.

## 2018-05-17 NOTE — ED Provider Notes (Addendum)
[facility] EMERGENCY DEPARTMENT Provider Note   CSN: [id number] Arrival date & time: 05/17/18  0818     History   Chief Complaint Chief Complaint  Patient presents with  . Chest Pain    HPI Alejandra Patel is a 37 y.o. female.  HPI  37 year old female presents with chest pain.  Started 3 or 4 days ago but seem to get worse last night around 11 PM.  The pain is hard to describe and has had multiple qualities including sharp, dull, achy, heavy.  Has had shortness of breath as well as some arm and neck tingling.  Some mid back pain as well.  She rates the pain is middle, about 6 out of 10.  She took some ibuprofen last night and is not sure if it helped but she did fall asleep.  No leg swelling, recent travel or recent surgery.  She is not on estrogen.  Laying flat seems to make some of her symptoms a little worse but there is no exertional component.  She states she has had some issues with hypertension but is not on medicines.  Has had gestational diabetes but no diabetes otherwise.  No known cholesterol problems.  She states she smokes a couple times a month but not daily.  No illicit drug use.  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  Past Medical History:  Diagnosis Date  . Gestational diabetes    with g2 and g3  . Headache(784.0)   . Pregnancy induced hypertension     Patient Active Problem List   Diagnosis Date Noted  . Abnormal uterine bleeding (AUB) 03/20/2014    History reviewed. No pertinent surgical history.   OB History    Gravida  5   Para  3   Term  3   Preterm      AB  1   Living  3     SAB      TAB      Ectopic  1   Multiple      Live Births  1            Home Medications    Prior to Admission medications   Medication Sig Start Date End Date Taking? Authorizing Provider  albuterol (PROVENTIL HFA;VENTOLIN HFA) 108 (90 Base) MCG/ACT inhaler Inhale 1-2 puffs into the lungs every 6 (six) hours as  needed for wheezing or shortness of breath. 01/29/17   Deatra Canter, FNP  amoxicillin (AMOXIL) 875 MG tablet Take 1 tablet (875 mg total) by mouth 2 (two) times daily. 01/29/17   Deatra Canter, FNP  ferrous sulfate 325 (65 FE) MG tablet Take 1 tablet (325 mg total) by mouth daily. 11/10/17   Bethel Born, PA-C  guaiFENesin-codeine (CHERATUSSIN AC) 100-10 MG/5ML syrup Take 5 mLs by mouth 3 (three) times daily as needed for cough. 01/29/17   Deatra Canter, FNP  methylPREDNISolone (MEDROL DOSEPAK) 4 MG TBPK tablet Take 6-5-4-3-2-1 po qd 01/29/17   Deatra Canter, FNP    Family History Family History  Problem Relation Age of Onset  . Ovarian cysts Sister     Social History Social History   Tobacco Use  . Smoking status: Light Tobacco Smoker    Types: Cigars  . Smokeless tobacco: Never Used  Substance Use Topics  . Alcohol use: No    Comment: occasion  . Drug use: No     Allergies   Benadryl [diphenhydramine hcl]  Review of Systems Review of Systems  Constitutional: Negative for fever.  Respiratory: Positive for shortness of breath.   Cardiovascular: Positive for chest pain. Negative for leg swelling.  Gastrointestinal: Negative for vomiting.  Musculoskeletal: Positive for back pain.  All other systems reviewed and are negative.    Physical Exam Updated Vital Signs BP 138/90 (BP Location: Right Arm)   Pulse 73   Temp 98 F (36.7 C) (Oral)   Resp 15   Ht 5\' 7"  (1.702 m)   Wt 97.1 kg (214 lb)   SpO2 100%   BMI 33.52 kg/m   Physical Exam  Constitutional: She is oriented to person, place, and time. She appears well-developed and well-nourished.  Non-toxic appearance. She does not appear ill.  HENT:  Head: Normocephalic and atraumatic.  Right Ear: External ear normal.  Left Ear: External ear normal.  Nose: Nose normal.  Eyes: Right eye exhibits no discharge. Left eye exhibits no discharge.  Cardiovascular: Normal rate, regular rhythm and normal  heart sounds.  Pulses:      Radial pulses are 2+ on the right side, and 2+ on the left side.  Pulmonary/Chest: Effort normal and breath sounds normal. She exhibits no tenderness.  Abdominal: Soft. She exhibits no distension. There is no tenderness.  Musculoskeletal:       Right lower leg: She exhibits no edema.       Left lower leg: She exhibits no edema.  Neurological: She is alert and oriented to person, place, and time.  Skin: Skin is warm and dry.  Nursing note and vitals reviewed.    ED Treatments / Results  Labs (all labs ordered are listed, but only abnormal results are displayed) Labs Reviewed  BASIC METABOLIC PANEL - Abnormal; Notable for the following components:      Result Value   Glucose, Bld 104 (*)    All other components within normal limits  CBC - Abnormal; Notable for the following components:   Hemoglobin 11.2 (*)    MCH 24.2 (*)    All other components within normal limits  I-STAT TROPONIN, ED  I-STAT BETA HCG BLOOD, ED ([facility], [facility], [facility] ONLY)    EKG EKG Interpretation  Date/Time:  Monday May 17 2018 08:27:57 EDT Ventricular Rate:  67 PR Interval:    QRS Duration: 88 QT Interval:  391 QTC Calculation: 413 R Axis:   79 Text Interpretation:  Normal sinus rhythm Low voltage, precordial leads Nonspecific T abnormalities, lateral leads no significant change since Dec 2018 Confirmed by Pricilla LovelessGoldston, Shanese Riemenschneider [id number]) on 05/17/2018 8:36:38 AM   Radiology Dg Chest 2 View  Result Date: 05/17/2018 CLINICAL DATA:  Left-sided chest pain radiating to the left arm and back EXAM: CHEST - 2 VIEW COMPARISON:  Chest x-ray of 01/12/2018 FINDINGS: No active infiltrate or effusion is seen. Mediastinal and hilar contours are unremarkable. The heart is within normal limits in size. No bony abnormality is seen. IMPRESSION: No active cardiopulmonary disease. Electronically Signed   By: Dwyane DeePaul  Barry M.D.   On: 05/17/2018 09:29    Procedures Procedures (including critical care time)     EMERGENCY DEPARTMENT US CARDIAC EXAM "Study: Limited Ultrasound of the Heart and Pericardium"  INDICATIONS:Chest pain Multiple views of the heart and pericardium were obtained in real-time with a multi-frequency probe.  PERFORMED QM:VHQIONBY:Myself IMAGES ARCHIVED?: Yes LIMITATIONS:  None VIEWS USED: Subcostal 4 chamber INTERPRETATION: Cardiac activity present and Pericardial effusioin absent  Medications Ordered in ED Medications - No data to display   Initial Impression /  Assessment and Plan / ED Course  I have reviewed the triage vital signs and the nursing notes.  Pertinent labs & imaging results that were available during my care of the patient were reviewed by me and considered in my medical decision making (see chart for details).     Patient's chest pain is atypical. Her HEART score is a 2. The pain has been constant for several days, and the worsening pain last night has been constant now for over 9+ hours. Given length of time of constant chest pain, I don't think 2nd troponin is negative.  I doubt this is ACS.  She has no obvious risk factors for PE and is overall well-appearing.  She is not tachycardic, hypoxic, or having increased work of breathing.  Given some positional symptoms, bedside ultrasound obtained to help rule out pericardial effusion, which is not seen.  I doubt dissection.  Unclear exact etiology but I think she is stable for outpatient work-up with a PCP.  We discussed that while she does not have an obvious MI right now, this cannot fully rule out coronary disease and she needs to follow-up but also return if any symptoms were to worsen.   Final Clinical Impressions(s) / ED Diagnoses   Final diagnoses:  Nonspecific chest pain    ED Discharge Orders    None       Pricilla Loveless, MD 05/17/18 1004    Pricilla Loveless, MD 05/17/18 1005

## 2019-08-16 ENCOUNTER — Other Ambulatory Visit: Payer: Self-pay

## 2019-08-16 ENCOUNTER — Encounter (HOSPITAL_COMMUNITY): Payer: Self-pay | Admitting: Emergency Medicine

## 2019-08-16 ENCOUNTER — Ambulatory Visit (HOSPITAL_COMMUNITY)
Admission: EM | Admit: 2019-08-16 | Discharge: 2019-08-16 | Disposition: A | Discharge status: Home / Self Care | Payer: BC Managed Care – PPO | Attending: Family Medicine | Admitting: Family Medicine

## 2019-08-16 DIAGNOSIS — H9201 Otalgia, right ear: Secondary | ICD-10-CM

## 2019-08-16 DIAGNOSIS — R030 Elevated blood-pressure reading, without diagnosis of hypertension: Secondary | ICD-10-CM

## 2019-08-16 MED ORDER — PREDNISONE 10 MG (21) PO TBPK: ORAL_TABLET | Freq: Every day | ORAL | 0 refills | Status: DC

## 2019-08-16 NOTE — ED Provider Notes (Signed)
[place]   [id number] 08/16/19 Arrival Time: 4696  ASSESSMENT & PLAN:  1. Right ear pain   2. Elevated blood pressure reading without diagnosis of hypertension     No signs of ear infection or mastoiditis. Prefers trial of OTC ibuprofen 600mg  TID with food for the next few days. If not helping, she will begin:  Meds ordered this encounter  Medications  . predniSONE (STERAPRED UNI-PAK 21 TAB) 10 MG (21) TBPK tablet    Sig: Take by mouth daily. Take as directed.    Dispense:  21 tablet    Refill:  0   Recommend: Follow-up Information    [facility].   Why: If worsening or failing to improve as anticipated. Contact information: [address] [place] [address] [phone]          Has new PCP visit in a couple of months. May recheck BP here at any time.  Reviewed expectations re: course of current medical issues. Questions answered. Outlined signs and symptoms indicating need for more acute intervention. Patient verbalized understanding. After Visit Summary given.   SUBJECTIVE: History from: patient.  Alejandra Patel is a 38 y.o. female who presents with complaint of right otalgia; without drainage; without bleeding. Onset gradual, over the past week. Recent cold symptoms: none. Fever: no. Overall normal PO intake without n/v. Sick contacts: no. Normal hearing. OTC ear gtts without change in symptoms. No nasal congestion or respiratory symptoms reported. No specific aggravating or alleviating factors reported. OTC treatment: none reported.  Social History   Tobacco Use  Smoking Status Light Tobacco Smoker  . Types: Cigars  Smokeless Tobacco Never Used   Increased blood pressure noted today. Reports that she has been treated for hypertension in the past; only with pregnancy.  She reports no chest pain on exertion, no dyspnea on exertion, no swelling of ankles, no orthostatic dizziness or  lightheadedness, no orthopnea or paroxysmal nocturnal dyspnea, no palpitations and no intermittent claudication symptoms.  ROS: As per HPI.  ll other systems negative.   OBJECTIVE:  Vitals:   08/16/19 1656  BP: (!) 140/103  Resp: 18  Temp: 98.9 F (37.2 C)  SpO2: 100%     General appearance: alert; NAD HEENT: ; AT Ear Canal: normal bilaterally TM: normal bilaterally Neck: supple without LAD CV: regular pulse Lungs: unlabored respirations, symmetrical air entry; cough: absent; no respiratory distress Skin: warm and dry Psychological: alert and cooperative; normal mood and affect  Allergies  Allergen Reactions  . Benadryl [Diphenhydramine Hcl] Swelling    Past Medical History:  Diagnosis Date  . Gestational diabetes    with g2 and g3  . Headache(784.0)   . Pregnancy induced hypertension    Family History  Problem Relation Age of Onset  . Ovarian cysts Sister    Social History   Socioeconomic History  . Marital status: Single    Spouse name: Not on file  . Number of children: Not on file  . Years of education: Not on file  . Highest education level: Not on file  Occupational History  . Not on file  Social Needs  . Financial resource strain: Not on file  . Food insecurity    Worry: Not on file    Inability: Not on file  . Transportation needs    Medical: Not on file    Non-medical: Not on file  Tobacco Use  . Smoking status: Light Tobacco Smoker    Types:  Cigars  . Smokeless tobacco: Never Used  Substance and Sexual Activity  . Alcohol use: No    Comment: occasion  . Drug use: No  . Sexual activity: Yes    Birth control/protection: None  Lifestyle  . Physical activity    Days per week: Not on file    Minutes per session: Not on file  . Stress: Not on file  Relationships  . Social Musicianconnections    Talks on phone: Not on file    Gets together: Not on file    Attends religious service: Not on file    Active member of club or organization: Not  on file    Attends meetings of clubs or organizations: Not on file    Relationship status: Not on file  . Intimate partner violence    Fear of current or ex partner: Not on file    Emotionally abused: Not on file    Physically abused: Not on file    Forced sexual activity: Not on file  Other Topics Concern  . Not on file  Social History Narrative  . Not on file            Mardella LaymanHagler, Naksh Radi, MD 08/17/19 [phone]

## 2019-08-16 NOTE — Discharge Instructions (Addendum)
Your blood pressure was noted to be elevated during your visit today. You may return here within the next few days to recheck if unable to see your primary care doctor.  BP 140/103

## 2019-08-16 NOTE — ED Triage Notes (Signed)
Started with right sided ear pain and now ear is tender. Denies sore throat, congestion.

## 2019-10-24 ENCOUNTER — Other Ambulatory Visit (HOSPITAL_COMMUNITY)
Admission: RE | Admit: 2019-10-24 | Discharge: 2019-10-24 | Disposition: A | Discharge status: Home / Self Care | Payer: BC Managed Care – PPO | Source: Ambulatory Visit | Attending: Family Medicine | Admitting: Family Medicine

## 2019-10-24 ENCOUNTER — Other Ambulatory Visit: Payer: Self-pay | Admitting: Family Medicine

## 2019-10-24 DIAGNOSIS — Z124 Encounter for screening for malignant neoplasm of cervix: Secondary | ICD-10-CM | POA: Insufficient documentation

## 2019-10-27 LAB — CYTOLOGY - PAP
Chlamydia: NEGATIVE
Diagnosis: NEGATIVE
High risk HPV: NEGATIVE
Neisseria Gonorrhea: NEGATIVE

## 2020-02-24 ENCOUNTER — Other Ambulatory Visit: Payer: Self-pay

## 2020-02-24 ENCOUNTER — Ambulatory Visit (HOSPITAL_COMMUNITY)
Admission: EM | Admit: 2020-02-24 | Discharge: 2020-02-24 | Disposition: A | Discharge status: Home / Self Care | Payer: BC Managed Care – PPO | Attending: Family Medicine | Admitting: Family Medicine

## 2020-02-24 ENCOUNTER — Encounter (HOSPITAL_COMMUNITY): Payer: Self-pay | Admitting: Family Medicine

## 2020-02-24 DIAGNOSIS — M7661 Achilles tendinitis, right leg: Secondary | ICD-10-CM | POA: Diagnosis not present

## 2020-02-24 DIAGNOSIS — R0789 Other chest pain: Secondary | ICD-10-CM | POA: Diagnosis not present

## 2020-02-24 MED ORDER — DICLOFENAC SODIUM 75 MG PO TBEC: 75.0000 mg | DELAYED_RELEASE_TABLET | Freq: Two times a day (BID) | ORAL | 0 refills | Status: DC

## 2020-02-24 NOTE — ED Triage Notes (Signed)
Evaluated pt in waiting room based on c/c. Pt reports CP central and left sternal area x2days describes as tight/stabbing. Denies SOB, n/v, diaphoresis, blurred vision, radiating pain.  States pain increases when raising left arm, moving upper body.  States she has been under more stress recently.

## 2020-02-24 NOTE — ED Provider Notes (Signed)
[facility]    CSN: [id number] Arrival date & time: 02/24/20  1506      History   Chief Complaint Chief Complaint  Patient presents with  . Chest Pain    HPI Alejandra Patel is a 39 y.o. female.   Established [facility] patient presents with 2 days of right leg pain and left chest pain.  Denies SOB, n/v, diaphoresis, blurred vision, radiating pain.  Pain worse when raising right arm.  No shortness of breath, cough, or fever.  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  No recent change in activity or fall.  The left chest is tender.  The right achilles is tender as well.  Patient has tried no meds.  Has been under increased stress.     Past Medical History:  Diagnosis Date  . Gestational diabetes    with g2 and g3  . Headache(784.0)   . Pregnancy induced hypertension     Patient Active Problem List   Diagnosis Date Noted  . Abnormal uterine bleeding (AUB) 03/20/2014    History reviewed. No pertinent surgical history.  OB History    Gravida  5   Para  3   Term  3   Preterm      AB  1   Living  3     SAB      TAB      Ectopic  1   Multiple      Live Births  1            Home Medications    Prior to Admission medications   Medication Sig Start Date End Date Taking? Authorizing Provider  albuterol (PROVENTIL HFA;VENTOLIN HFA) 108 (90 Base) MCG/ACT inhaler Inhale 1-2 puffs into the lungs every 6 (six) hours as needed for wheezing or shortness of breath. 01/29/17   Deatra Canter, FNP  diclofenac (VOLTAREN) 75 MG EC tablet Take 1 tablet (75 mg total) by mouth 2 (two) times daily. 02/24/20   Elvina Sidle, MD  ferrous sulfate 325 (65 FE) MG tablet Take 1 tablet (325 mg total) by mouth daily. 11/10/17   Bethel Born, PA-C    Family History Family History  Problem Relation Age of Onset  . Ovarian cysts Sister     Social History Social History   Tobacco Use  . Smoking status: Light Tobacco Smoker    Types: Cigars  . Smokeless tobacco:  Never Used  Substance Use Topics  . Alcohol use: No    Comment: occasion  . Drug use: No     Allergies   Benadryl [diphenhydramine hcl] and Diphenhydramine hcl   Review of Systems Review of Systems  Cardiovascular: Positive for chest pain.  Musculoskeletal: Positive for gait problem.  All other systems reviewed and are negative.    Physical Exam Triage Vital Signs ED Triage Vitals  Enc Vitals Group     BP      Pulse      Resp      Temp      Temp src      SpO2      Weight      Height      Head Circumference      Peak Flow      Pain Score      Pain Loc      Pain Edu?      Excl. in GC?    No data found.  Updated Vital Signs BP (!) 143/92 (BP Location: Right Arm)  Pulse 87   Temp 99.3 F (37.4 C) (Oral)   Resp 18   LMP 02/04/2020 (Approximate)   SpO2 98%    Physical Exam Vitals and nursing note reviewed.  Constitutional:      Appearance: She is well-developed. She is obese.  HENT:     Head: Normocephalic.  Eyes:     Pupils: Pupils are equal, round, and reactive to light.  Cardiovascular:     Rate and Rhythm: Normal rate.     Heart sounds: Normal heart sounds.  Pulmonary:     Effort: Pulmonary effort is normal.     Breath sounds: Normal breath sounds.  Chest:     Chest wall: Tenderness present.  Musculoskeletal:        General: Normal range of motion.     Cervical back: Normal range of motion and neck supple.     Right lower leg: Tenderness present.     Comments: Mildly tender right achilles area  Skin:    General: Skin is warm and dry.  Neurological:     General: No focal deficit present.     Mental Status: She is alert.  Psychiatric:        Mood and Affect: Mood normal.        Behavior: Behavior normal.      UC Treatments / Results   EKG 12 lead EKG shows  No Acute Changes    Initial Impression / Assessment and Plan / UC Course  I have reviewed the triage vital signs and the nursing notes.  Pertinent labs & imaging results  that were available during my care of the patient were reviewed by me and considered in my medical decision making (see chart for details).    Final Clinical Impressions(s) / UC Diagnoses   Final diagnoses:  Chest wall pain  Achilles tendinitis of right lower extremity   Discharge Instructions   None    ED Prescriptions    Medication Sig Dispense Auth. Provider   diclofenac (VOLTAREN) 75 MG EC tablet Take 1 tablet (75 mg total) by mouth 2 (two) times daily. 14 tablet Robyn Haber, MD     I have reviewed the PDMP during this encounter.   Robyn Haber, MD 02/24/20 1559

## 2020-05-20 ENCOUNTER — Emergency Department (HOSPITAL_COMMUNITY)
Admission: EM | Admit: 2020-05-20 | Discharge: 2020-05-20 | Disposition: A | Discharge status: Home / Self Care | Payer: BC Managed Care – PPO | Attending: Emergency Medicine | Admitting: Emergency Medicine

## 2020-05-20 ENCOUNTER — Encounter (HOSPITAL_COMMUNITY): Payer: Self-pay | Admitting: Emergency Medicine

## 2020-05-20 ENCOUNTER — Other Ambulatory Visit: Payer: Self-pay

## 2020-05-20 DIAGNOSIS — Z5321 Procedure and treatment not carried out due to patient leaving prior to being seen by health care provider: Secondary | ICD-10-CM | POA: Insufficient documentation

## 2020-05-20 DIAGNOSIS — R220 Localized swelling, mass and lump, head: Secondary | ICD-10-CM | POA: Diagnosis present

## 2020-05-20 NOTE — ED Notes (Signed)
Pt came to desk and said she was feeling better and wanted to leave. Asked pt to saty but she was ok to go home

## 2020-05-20 NOTE — ED Triage Notes (Signed)
Patient reports allergic reaction this evening presents with mild left eyelid swelling and left lip swelling etiology unknown , alert and oriented /respirations unlabored , speech clear , no cough or fever .

## 2020-06-21 ENCOUNTER — Other Ambulatory Visit: Payer: Self-pay | Admitting: Physician Assistant

## 2020-06-21 DIAGNOSIS — L989 Disorder of the skin and subcutaneous tissue, unspecified: Secondary | ICD-10-CM

## 2020-06-26 ENCOUNTER — Ambulatory Visit
Admission: RE | Admit: 2020-06-26 | Discharge: 2020-06-26 | Disposition: A | Discharge status: Home / Self Care | Payer: BC Managed Care – PPO | Source: Ambulatory Visit | Attending: Physician Assistant | Admitting: Physician Assistant

## 2020-06-26 DIAGNOSIS — L989 Disorder of the skin and subcutaneous tissue, unspecified: Secondary | ICD-10-CM

## 2020-09-04 ENCOUNTER — Encounter (HOSPITAL_COMMUNITY): Payer: Self-pay | Admitting: Emergency Medicine

## 2020-09-04 ENCOUNTER — Ambulatory Visit (HOSPITAL_COMMUNITY)
Admission: EM | Admit: 2020-09-04 | Discharge: 2020-09-04 | Disposition: A | Discharge status: Home / Self Care | Payer: Medicaid Other | Attending: Emergency Medicine | Admitting: Emergency Medicine

## 2020-09-04 ENCOUNTER — Other Ambulatory Visit: Payer: Self-pay

## 2020-09-04 DIAGNOSIS — R519 Headache, unspecified: Secondary | ICD-10-CM

## 2020-09-04 DIAGNOSIS — Z3201 Encounter for pregnancy test, result positive: Secondary | ICD-10-CM | POA: Diagnosis present

## 2020-09-04 LAB — POCT URINALYSIS DIPSTICK, ED / UC
Bilirubin Urine: NEGATIVE
Glucose, UA: NEGATIVE mg/dL
Hgb urine dipstick: NEGATIVE
Ketones, ur: NEGATIVE mg/dL
Leukocytes,Ua: NEGATIVE
Nitrite: NEGATIVE
Protein, ur: NEGATIVE mg/dL
Specific Gravity, Urine: 1.02 (ref 1.005–1.030)
Urobilinogen, UA: 0.2 mg/dL (ref 0.0–1.0)
pH: 7.5 (ref 5.0–8.0)

## 2020-09-04 LAB — CBC
HCT: 37.8 % (ref 36.0–46.0)
Hemoglobin: 11.7 g/dL — ABNORMAL LOW (ref 12.0–15.0)
MCH: 24.6 pg — ABNORMAL LOW (ref 26.0–34.0)
MCHC: 31 g/dL (ref 30.0–36.0)
MCV: 79.6 fL — ABNORMAL LOW (ref 80.0–100.0)
Platelets: 208 10*3/uL (ref 150–400)
RBC: 4.75 MIL/uL (ref 3.87–5.11)
RDW: 15.2 % (ref 11.5–15.5)
WBC: 7.7 10*3/uL (ref 4.0–10.5)
nRBC: 0 % (ref 0.0–0.2)

## 2020-09-04 LAB — POC URINE PREG, ED: Preg Test, Ur: POSITIVE — AB

## 2020-09-04 NOTE — Discharge Instructions (Signed)
Your pregnancy test is positive Please follow up with Ob/GYN We are checking you for anemia Make sure that you are drinking plenty of water.  Follow up as needed for continued or worsening symptoms

## 2020-09-04 NOTE — ED Provider Notes (Signed)
[facility]    CSN: [id number] Arrival date & time: 09/04/20  0818      History   Chief Complaint Chief Complaint  Patient presents with  . Headache    HPI Alejandra Patel is a 39 y.o. female.   Patient is a 39 year old female that presents today for headache, fatigue, weakness, intermittent abdominal cramping for the past 2 weeks.  No current abdominal pain.  No vaginal bleeding, dysuria, hematuria, urinary frequency.  No vaginal discharge, nausea or vomiting. Patient's last menstrual period was 07/27/2020. Reporting she took a pregnancy test at home that was negative.      Past Medical History:  Diagnosis Date  . Gestational diabetes    with g2 and g3  . Headache(784.0)   . Pregnancy induced hypertension     Patient Active Problem List   Diagnosis Date Noted  . Abnormal uterine bleeding (AUB) 03/20/2014    History reviewed. No pertinent surgical history.  OB History    Gravida  5   Para  3   Term  3   Preterm      AB  1   Living  3     SAB      TAB      Ectopic  1   Multiple      Live Births  1            Home Medications    Prior to Admission medications   Medication Sig Start Date End Date Taking? Authorizing Provider  albuterol (PROVENTIL HFA;VENTOLIN HFA) 108 (90 Base) MCG/ACT inhaler Inhale 1-2 puffs into the lungs every 6 (six) hours as needed for wheezing or shortness of breath. 01/29/17   Deatra Canter, FNP  diclofenac (VOLTAREN) 75 MG EC tablet Take 1 tablet (75 mg total) by mouth 2 (two) times daily. 02/24/20   Elvina Sidle, MD  ferrous sulfate 325 (65 FE) MG tablet Take 1 tablet (325 mg total) by mouth daily. 11/10/17   Bethel Born, PA-C    Family History Family History  Problem Relation Age of Onset  . Ovarian cysts Sister     Social History Social History   Tobacco Use  . Smoking status: Light Tobacco Smoker    Types: Cigars  . Smokeless tobacco: Never Used  Substance Use Topics  .  Alcohol use: No    Comment: occasion  . Drug use: No     Allergies   Benadryl [diphenhydramine hcl] and Diphenhydramine hcl   Review of Systems Review of Systems   Physical Exam Triage Vital Signs ED Triage Vitals  Enc Vitals Group     BP 09/04/20 0850 137/88     Pulse Rate 09/04/20 0850 (!) 58     Resp 09/04/20 0850 16     Temp 09/04/20 0850 99 F (37.2 C)     Temp Source 09/04/20 0850 Oral     SpO2 09/04/20 0850 100 %     Weight --      Height --      Head Circumference --      Peak Flow --      Pain Score 09/04/20 0848 0     Pain Loc --      Pain Edu? --      Excl. in GC? --    No data found.  Updated Vital Signs BP 137/88 (BP Location: Left Arm)   Pulse (!) 58   Temp 99 F (37.2 C) (Oral)   Resp 16  LMP 07/27/2020   SpO2 100%   Visual Acuity Right Eye Distance:   Left Eye Distance:   Bilateral Distance:    Right Eye Near:   Left Eye Near:    Bilateral Near:     Physical Exam Vitals and nursing note reviewed.  Constitutional:      General: She is not in acute distress.    Appearance: Normal appearance. She is not ill-appearing, toxic-appearing or diaphoretic.  HENT:     Head: Normocephalic.     Nose: Nose normal.  Eyes:     Conjunctiva/sclera: Conjunctivae normal.  Pulmonary:     Effort: Pulmonary effort is normal.  Musculoskeletal:        General: Normal range of motion.     Cervical back: Normal range of motion.  Skin:    General: Skin is warm and dry.     Findings: No rash.  Neurological:     Mental Status: She is alert.  Psychiatric:        Mood and Affect: Mood normal.      UC Treatments / Results  Labs (all labs ordered are listed, but only abnormal results are displayed) Labs Reviewed  CBC - Abnormal; Notable for the following components:      Result Value   Hemoglobin 11.7 (*)    MCV 79.6 (*)    MCH 24.6 (*)    All other components within normal limits  POC URINE PREG, ED - Abnormal; Notable for the following  components:   Preg Test, Ur POSITIVE (*)    All other components within normal limits  POCT URINALYSIS DIPSTICK, ED / UC    EKG   Radiology No results found.  Procedures Procedures (including critical care time)  Medications Ordered in UC Medications - No data to display  Initial Impression / Assessment and Plan / UC Course  I have reviewed the triage vital signs and the nursing notes.  Pertinent labs & imaging results that were available during my care of the patient were reviewed by me and considered in my medical decision making (see chart for details).     Positive pregnancy test Patient not currently having any abdominal pain at this time.  Doubt ectopic pregnancy.  She reported history of ectopic prior to her first child 13 years ago. Ruling out anemia Given for OB/GYN for follow-up Recommended prenatal vitamin and iron supplement. Drink plenty of water to stay hydrated Follow up as needed for continued or worsening symptoms  Final Clinical Impressions(s) / UC Diagnoses   Final diagnoses:  Positive pregnancy test     Discharge Instructions     Your pregnancy test is positive Please follow up with Ob/GYN We are checking you for anemia Make sure that you are drinking plenty of water.  Follow up as needed for continued or worsening symptoms     ED Prescriptions    None     PDMP not reviewed this encounter.   Dahlia Byes A, NP 09/04/20 1036

## 2020-09-04 NOTE — ED Triage Notes (Signed)
Pt presents with headache and "hazziness in head" and cramping in abdominal area xs 2 wks but had gotten worse over last 2-3 days.

## 2020-09-24 DIAGNOSIS — L729 Follicular cyst of the skin and subcutaneous tissue, unspecified: Secondary | ICD-10-CM | POA: Diagnosis not present

## 2020-10-02 DIAGNOSIS — L728 Other follicular cysts of the skin and subcutaneous tissue: Secondary | ICD-10-CM | POA: Diagnosis not present

## 2020-10-02 HISTORY — PX: CYST EXCISION: SHX5701

## 2020-10-05 ENCOUNTER — Other Ambulatory Visit: Payer: Self-pay

## 2020-10-05 ENCOUNTER — Ambulatory Visit (INDEPENDENT_AMBULATORY_CARE_PROVIDER_SITE_OTHER): Payer: Medicaid Other

## 2020-10-05 VITALS — BP 146/92 | HR 67 | Ht 67.0 in | Wt 249.2 lb

## 2020-10-05 DIAGNOSIS — K5900 Constipation, unspecified: Secondary | ICD-10-CM

## 2020-10-05 DIAGNOSIS — O3680X0 Pregnancy with inconclusive fetal viability, not applicable or unspecified: Secondary | ICD-10-CM

## 2020-10-05 DIAGNOSIS — Z3481 Encounter for supervision of other normal pregnancy, first trimester: Secondary | ICD-10-CM

## 2020-10-05 DIAGNOSIS — Z789 Other specified health status: Secondary | ICD-10-CM

## 2020-10-05 DIAGNOSIS — Z3A12 12 weeks gestation of pregnancy: Secondary | ICD-10-CM | POA: Diagnosis not present

## 2020-10-05 DIAGNOSIS — O99611 Diseases of the digestive system complicating pregnancy, first trimester: Secondary | ICD-10-CM

## 2020-10-05 MED ORDER — BLOOD PRESSURE KIT DEVI: 1.0000 | 0 refills | Status: AC

## 2020-10-05 MED ORDER — DOCUSATE SODIUM 100 MG PO CAPS: 100.0000 mg | ORAL_CAPSULE | Freq: Two times a day (BID) | ORAL | 2 refills | Status: DC | PRN

## 2020-10-05 MED ORDER — PREPLUS 27-1 MG PO TABS: 1.0000 | ORAL_TABLET | Freq: Every day | ORAL | 13 refills | Status: DC

## 2020-10-05 NOTE — Progress Notes (Signed)
PRENATAL INTAKE SUMMARY  Ms. Schindler presents today New OB Nurse Interview.  OB History    Gravida  6   Para  3   Term  3   Preterm      AB  2   Living  3     SAB  1   TAB      Ectopic  1   Multiple      Live Births  3          I have reviewed the patient's medical, obstetrical, social, and family histories, medications, and available lab results.  SUBJECTIVE She complains of having bloating and constipation.  OBJECTIVE Initial Physical Exam (New OB)  GENERAL APPEARANCE: alert, well appearing   ASSESSMENT Normal pregnancy  PLAN Prenatal care to be completed at [place] labs to be completed at [facility] provider visit Baby Scripts ordered Blood pressure kit ordered U/S performed today [place] F[place] 166 PHQ2 score: 0 GAD 7 score: 5 Prenatal vitamins sent to pharmacy Colace sent to pharmacy per protocol for constipation

## 2020-10-06 NOTE — Progress Notes (Signed)
Patient was assessed and managed by nursing staff during this encounter. I have reviewed the chart and agree with the documentation and plan. I have also made any necessary editorial changes.  Lawrence A Bass, MD 10/06/2020 12:07 AM   

## 2020-10-19 ENCOUNTER — Other Ambulatory Visit: Payer: Self-pay

## 2020-10-19 ENCOUNTER — Ambulatory Visit (INDEPENDENT_AMBULATORY_CARE_PROVIDER_SITE_OTHER): Payer: Medicaid Other | Admitting: Obstetrics & Gynecology

## 2020-10-19 ENCOUNTER — Encounter: Payer: Self-pay | Admitting: Obstetrics & Gynecology

## 2020-10-19 ENCOUNTER — Other Ambulatory Visit (HOSPITAL_COMMUNITY)
Admission: RE | Admit: 2020-10-19 | Discharge: 2020-10-19 | Disposition: A | Discharge status: Home / Self Care | Payer: Medicaid Other | Source: Ambulatory Visit | Attending: Obstetrics & Gynecology | Admitting: Obstetrics & Gynecology

## 2020-10-19 VITALS — BP 145/89 | HR 80 | Wt 255.0 lb

## 2020-10-19 DIAGNOSIS — Z3A14 14 weeks gestation of pregnancy: Secondary | ICD-10-CM | POA: Diagnosis not present

## 2020-10-19 DIAGNOSIS — O09522 Supervision of elderly multigravida, second trimester: Secondary | ICD-10-CM

## 2020-10-19 DIAGNOSIS — Z3481 Encounter for supervision of other normal pregnancy, first trimester: Secondary | ICD-10-CM | POA: Insufficient documentation

## 2020-10-19 DIAGNOSIS — O99212 Obesity complicating pregnancy, second trimester: Secondary | ICD-10-CM | POA: Diagnosis not present

## 2020-10-19 DIAGNOSIS — Z3685 Encounter for antenatal screening for Streptococcus B: Secondary | ICD-10-CM | POA: Diagnosis not present

## 2020-10-19 DIAGNOSIS — O09529 Supervision of elderly multigravida, unspecified trimester: Secondary | ICD-10-CM | POA: Insufficient documentation

## 2020-10-19 DIAGNOSIS — O162 Unspecified maternal hypertension, second trimester: Secondary | ICD-10-CM | POA: Diagnosis not present

## 2020-10-19 DIAGNOSIS — O99210 Obesity complicating pregnancy, unspecified trimester: Secondary | ICD-10-CM | POA: Insufficient documentation

## 2020-10-19 DIAGNOSIS — O169 Unspecified maternal hypertension, unspecified trimester: Secondary | ICD-10-CM

## 2020-10-19 DIAGNOSIS — Z131 Encounter for screening for diabetes mellitus: Secondary | ICD-10-CM | POA: Diagnosis not present

## 2020-10-19 MED ORDER — ASPIRIN EC 81 MG PO TBEC: 81.0000 mg | DELAYED_RELEASE_TABLET | Freq: Every day | ORAL | 11 refills | Status: DC

## 2020-10-19 NOTE — Progress Notes (Signed)
NOB NOB Intake Done on 10/05/20.  Last pap: 10/24/2019 WNL  Genetic Screening: Declined  Pt has B/P cuff.   CC: Ear pain x 1 day or 2 ago. Pt has appt w/ PCP on 10/25/20.

## 2020-10-19 NOTE — Patient Instructions (Signed)

## 2020-10-19 NOTE — Progress Notes (Signed)
  Subjective:nausea is improving    Alejandra Patel is a [id number] [redacted]w[redacted]d being seen today for her first obstetrical visit.  Her obstetrical history is significant for advanced maternal age and obesity. Patient does intend to breast feed. Pregnancy history fully reviewed.  Patient reports no complaints.  Vitals:   10/19/20 1008  BP: (!) 145/89  Pulse: 80  Weight: 255 lb (115.7 kg)    HISTORY: OB History  Gravida Para Term Preterm AB Living  6 3 3   2 3   SAB TAB Ectopic Multiple Live Births  1   1   3     # Outcome Date GA Lbr Len/2nd Weight Sex Delivery Anes PTL Lv  6 Current           5 Term 02/03/13 [redacted]w[redacted]d 05:46 / 00:08 9 lb 11.7 oz (4.414 kg) M Vag-Spont None  LIV  4 Term 06/14/09    F Vag-Spont   LIV  3 Term 09/15/06    M Vag-Spont   LIV  2 SAB           1 Ectopic            Past Medical History:  Diagnosis Date  . Gestational diabetes    with g2 and g3  . Headache(784.0)   . Pregnancy induced hypertension    Past Surgical History:  Procedure Laterality Date  . CYST EXCISION  10/02/2020   Head  . TOOTH EXTRACTION  2017   Family History  Problem Relation Age of Onset  . Ovarian cysts Sister      Exam    Uterus:     Pelvic Exam:    Perineum: No Hemorrhoids   Vulva: normal   Vagina:  normal mucosa   pH:     Cervix: no lesions   Adnexa: normal adnexa   Bony Pelvis: average  System: Breast:  normal appearance, no masses or tenderness   Skin: normal coloration and turgor, no rashes    Neurologic: oriented, normal mood   Extremities: normal strength, tone, and muscle mass   HEENT PERRLA, extra ocular movement intact, neck supple with midline trachea and thyroid without masses   Mouth/Teeth mucous membranes moist, pharynx normal without lesions and dental hygiene good   Neck supple and no masses   Cardiovascular: regular rate and rhythm, no murmurs or gallops   Respiratory:  appears well, vitals normal, no respiratory distress, acyanotic, normal RR, neck free  of mass or lymphadenopathy, chest clear, no wheezing, crepitations, rhonchi, normal symmetric air entry   Abdomen: soft, non-tender; bowel sounds normal; no masses,  no organomegaly   Urinary: urethral meatus normal      Assessment:    Pregnancy: 10/04/2020 Patient Active Problem List   Diagnosis Date Noted  . Antepartum multigravida of advanced maternal age 27/11/2020  . Encounter for supervision of other normal pregnancy, first trimester 10/05/2020        Plan:     Initial labs drawn. Prenatal vitamins. Problem list reviewed and updated. Genetic Screening discussed  declined.  Ultrasound discussed; fetal survey: ordered.  Follow up in 4 weeks. 50% of 30 min visit spent on counseling and coordination of care.  Discussed increase risk of aneuploidy with AMA  Possible CHTN, advised ASA 81 mg/day Blood pressure cuff for home monitoring  13/11/2020 10/19/2020

## 2020-10-20 LAB — CBC/D/PLT+RPR+RH+ABO+RUB AB...
Antibody Screen: NEGATIVE
Basophils Absolute: 0 10*3/uL (ref 0.0–0.2)
Basos: 0 %
EOS (ABSOLUTE): 0.1 10*3/uL (ref 0.0–0.4)
Eos: 1 %
HCV Ab: <0.1 s/co ratio (ref 0.0–0.9)
HIV Screen 4th Generation wRfx: NONREACTIVE
Hematocrit: 35.3 % (ref 34.0–46.6)
Hemoglobin: 11.6 g/dL (ref 11.1–15.9)
Hepatitis B Surface Ag: NEGATIVE
Immature Grans (Abs): 0.1 10*3/uL (ref 0.0–0.1)
Immature Granulocytes: 1 %
Lymphocytes Absolute: 1.6 10*3/uL (ref 0.7–3.1)
Lymphs: 16 %
MCH: 26 pg — ABNORMAL LOW (ref 26.6–33.0)
MCHC: 32.9 g/dL (ref 31.5–35.7)
MCV: 79 fL (ref 79–97)
Monocytes Absolute: 0.7 10*3/uL (ref 0.1–0.9)
Monocytes: 6 %
Neutrophils Absolute: 7.8 10*3/uL — ABNORMAL HIGH (ref 1.4–7.0)
Neutrophils: 76 %
Platelets: 188 10*3/uL (ref 150–450)
RBC: 4.46 x10E6/uL (ref 3.77–5.28)
RDW: 15.2 % (ref 11.7–15.4)
RPR Ser Ql: NONREACTIVE
Rh Factor: POSITIVE
Rubella Antibodies, IGG: 5.63 index (ref >0.99)
WBC: 10.3 10*3/uL (ref 3.4–10.8)

## 2020-10-20 LAB — HEMOGLOBIN A1C
Est. average glucose Bld gHb Est-mCnc: 120 mg/dL
Hgb A1c MFr Bld: 5.8 % — ABNORMAL HIGH (ref 4.8–5.6)

## 2020-10-20 LAB — HCV INTERPRETATION

## 2020-10-21 LAB — URINE CULTURE, OB REFLEX: Organism ID, Bacteria: NO GROWTH

## 2020-10-21 LAB — CULTURE, OB URINE

## 2020-10-22 LAB — CERVICOVAGINAL ANCILLARY ONLY
Bacterial Vaginitis (gardnerella): NEGATIVE
Candida Glabrata: NEGATIVE
Candida Vaginitis: NEGATIVE
Chlamydia: NEGATIVE
Comment: NEGATIVE
Comment: NEGATIVE
Comment: NEGATIVE
Comment: NEGATIVE
Comment: NEGATIVE
Comment: NORMAL
Neisseria Gonorrhea: NEGATIVE
Trichomonas: NEGATIVE

## 2020-10-25 DIAGNOSIS — Z13220 Encounter for screening for lipoid disorders: Secondary | ICD-10-CM | POA: Diagnosis not present

## 2020-10-25 DIAGNOSIS — Z3491 Encounter for supervision of normal pregnancy, unspecified, first trimester: Secondary | ICD-10-CM | POA: Diagnosis not present

## 2020-10-25 DIAGNOSIS — Z0000 Encounter for general adult medical examination without abnormal findings: Secondary | ICD-10-CM | POA: Diagnosis not present

## 2020-10-25 DIAGNOSIS — Z8632 Personal history of gestational diabetes: Secondary | ICD-10-CM | POA: Diagnosis not present

## 2020-10-25 DIAGNOSIS — R7303 Prediabetes: Secondary | ICD-10-CM | POA: Diagnosis not present

## 2020-11-19 ENCOUNTER — Encounter: Payer: Medicaid Other | Admitting: Obstetrics and Gynecology

## 2020-11-19 DIAGNOSIS — Z3490 Encounter for supervision of normal pregnancy, unspecified, unspecified trimester: Secondary | ICD-10-CM | POA: Diagnosis not present

## 2020-11-19 DIAGNOSIS — R7303 Prediabetes: Secondary | ICD-10-CM | POA: Diagnosis not present

## 2020-11-23 ENCOUNTER — Ambulatory Visit: Payer: Medicaid Other

## 2020-11-27 DIAGNOSIS — Z36 Encounter for antenatal screening for chromosomal anomalies: Secondary | ICD-10-CM | POA: Diagnosis not present

## 2020-11-27 DIAGNOSIS — O09522 Supervision of elderly multigravida, second trimester: Secondary | ICD-10-CM | POA: Diagnosis not present

## 2020-11-27 DIAGNOSIS — I10 Essential (primary) hypertension: Secondary | ICD-10-CM | POA: Diagnosis not present

## 2020-11-27 DIAGNOSIS — Z3482 Encounter for supervision of other normal pregnancy, second trimester: Secondary | ICD-10-CM | POA: Diagnosis not present

## 2020-12-05 ENCOUNTER — Ambulatory Visit (INDEPENDENT_AMBULATORY_CARE_PROVIDER_SITE_OTHER): Payer: Medicaid Other | Admitting: Primary Care

## 2020-12-08 NOTE — L&D Delivery Note (Signed)
Delivery Note Called to room emergently for delivery of infant that occurred suddenly. Upon arrival to room, NICU team members resuscitating newborn infant. [facility], CNM at bedside clamping placenta.   At 2:29 AM a viable and healthy female was delivered via Vaginal, Spontaneous (Presentation: Unknown due to quick delivery).  APGAR: 8.3,8, ; weight: 2580g Placenta status: Spontaneous;Pathology, Intact.  Cord: 3 vessels with the following complications: None.  Cord pH: Collected  Anesthesia: Epidural Episiotomy: None Lacerations: None Suture Repair: N/A Est. Blood Loss (mL):  450cc  Mom to postpartum.  Baby to NICU on room air.  [name] 03/09/2021, 2:48 AM

## 2021-01-28 DIAGNOSIS — O09523 Supervision of elderly multigravida, third trimester: Secondary | ICD-10-CM | POA: Diagnosis not present

## 2021-01-28 DIAGNOSIS — O10919 Unspecified pre-existing hypertension complicating pregnancy, unspecified trimester: Secondary | ICD-10-CM | POA: Diagnosis not present

## 2021-01-30 DIAGNOSIS — Z3490 Encounter for supervision of normal pregnancy, unspecified, unspecified trimester: Secondary | ICD-10-CM | POA: Diagnosis not present

## 2021-01-30 DIAGNOSIS — Z3483 Encounter for supervision of other normal pregnancy, third trimester: Secondary | ICD-10-CM | POA: Diagnosis not present

## 2021-02-25 DIAGNOSIS — O09523 Supervision of elderly multigravida, third trimester: Secondary | ICD-10-CM | POA: Diagnosis not present

## 2021-02-25 DIAGNOSIS — Z3483 Encounter for supervision of other normal pregnancy, third trimester: Secondary | ICD-10-CM | POA: Diagnosis not present

## 2021-02-25 DIAGNOSIS — O10919 Unspecified pre-existing hypertension complicating pregnancy, unspecified trimester: Secondary | ICD-10-CM | POA: Diagnosis not present

## 2021-02-25 DIAGNOSIS — O24419 Gestational diabetes mellitus in pregnancy, unspecified control: Secondary | ICD-10-CM | POA: Diagnosis not present

## 2021-03-04 DIAGNOSIS — R030 Elevated blood-pressure reading, without diagnosis of hypertension: Secondary | ICD-10-CM | POA: Diagnosis not present

## 2021-03-04 DIAGNOSIS — Z23 Encounter for immunization: Secondary | ICD-10-CM | POA: Diagnosis not present

## 2021-03-04 DIAGNOSIS — Z3490 Encounter for supervision of normal pregnancy, unspecified, unspecified trimester: Secondary | ICD-10-CM | POA: Diagnosis not present

## 2021-03-04 DIAGNOSIS — O24419 Gestational diabetes mellitus in pregnancy, unspecified control: Secondary | ICD-10-CM | POA: Diagnosis not present

## 2021-03-04 DIAGNOSIS — O09523 Supervision of elderly multigravida, third trimester: Secondary | ICD-10-CM | POA: Diagnosis not present

## 2021-03-04 DIAGNOSIS — O10919 Unspecified pre-existing hypertension complicating pregnancy, unspecified trimester: Secondary | ICD-10-CM | POA: Diagnosis not present

## 2021-03-06 ENCOUNTER — Encounter (HOSPITAL_COMMUNITY): Payer: Self-pay | Admitting: Obstetrics and Gynecology

## 2021-03-06 ENCOUNTER — Other Ambulatory Visit: Payer: Self-pay | Admitting: Obstetrics and Gynecology

## 2021-03-06 ENCOUNTER — Inpatient Hospital Stay (HOSPITAL_COMMUNITY)
Admission: AD | Admit: 2021-03-06 | Discharge: 2021-03-11 | DRG: 807 | Disposition: A | Discharge status: Home / Self Care | Payer: Medicaid Other | Attending: Obstetrics and Gynecology | Admitting: Obstetrics and Gynecology

## 2021-03-06 ENCOUNTER — Other Ambulatory Visit: Payer: Self-pay

## 2021-03-06 DIAGNOSIS — O24420 Gestational diabetes mellitus in childbirth, diet controlled: Secondary | ICD-10-CM | POA: Diagnosis present

## 2021-03-06 DIAGNOSIS — O114 Pre-existing hypertension with pre-eclampsia, complicating childbirth: Principal | ICD-10-CM | POA: Diagnosis present

## 2021-03-06 DIAGNOSIS — O1002 Pre-existing essential hypertension complicating childbirth: Secondary | ICD-10-CM | POA: Diagnosis present

## 2021-03-06 DIAGNOSIS — Z3A33 33 weeks gestation of pregnancy: Secondary | ICD-10-CM

## 2021-03-06 DIAGNOSIS — Z87891 Personal history of nicotine dependence: Secondary | ICD-10-CM | POA: Diagnosis not present

## 2021-03-06 DIAGNOSIS — O1493 Unspecified pre-eclampsia, third trimester: Secondary | ICD-10-CM | POA: Diagnosis present

## 2021-03-06 DIAGNOSIS — Z20822 Contact with and (suspected) exposure to covid-19: Secondary | ICD-10-CM | POA: Diagnosis present

## 2021-03-06 DIAGNOSIS — Z3481 Encounter for supervision of other normal pregnancy, first trimester: Secondary | ICD-10-CM

## 2021-03-06 LAB — HEMOGLOBIN A1C
Hgb A1c MFr Bld: 6.2 % — ABNORMAL HIGH (ref 4.8–5.6)
Mean Plasma Glucose: 131.24 mg/dL

## 2021-03-06 LAB — TYPE AND SCREEN
ABO/RH(D): O POS
Antibody Screen: NEGATIVE

## 2021-03-06 LAB — COMPREHENSIVE METABOLIC PANEL
ALT: 150 U/L — ABNORMAL HIGH (ref 0–44)
AST: 74 U/L — ABNORMAL HIGH (ref 15–41)
Albumin: 2.7 g/dL — ABNORMAL LOW (ref 3.5–5.0)
Alkaline Phosphatase: 129 U/L — ABNORMAL HIGH (ref 38–126)
Anion gap: 9 (ref 5–15)
BUN: 10 mg/dL (ref 6–20)
CO2: 23 mmol/L (ref 22–32)
Calcium: 9.2 mg/dL (ref 8.9–10.3)
Chloride: 102 mmol/L (ref 98–111)
Creatinine, Ser: 0.94 mg/dL (ref 0.44–1.00)
GFR, Estimated: >60 mL/min (ref >60)
Glucose, Bld: 87 mg/dL (ref 70–99)
Potassium: 4.3 mmol/L (ref 3.5–5.1)
Sodium: 134 mmol/L — ABNORMAL LOW (ref 135–145)
Total Bilirubin: 0.3 mg/dL (ref 0.3–1.2)
Total Protein: 5.9 g/dL — ABNORMAL LOW (ref 6.5–8.1)

## 2021-03-06 LAB — CBC
HCT: 35.5 % — ABNORMAL LOW (ref 36.0–46.0)
Hemoglobin: 11.2 g/dL — ABNORMAL LOW (ref 12.0–15.0)
MCH: 25.7 pg — ABNORMAL LOW (ref 26.0–34.0)
MCHC: 31.5 g/dL (ref 30.0–36.0)
MCV: 81.4 fL (ref 80.0–100.0)
Platelets: 213 10*3/uL (ref 150–400)
RBC: 4.36 MIL/uL (ref 3.87–5.11)
RDW: 14.3 % (ref 11.5–15.5)
WBC: 7.4 10*3/uL (ref 4.0–10.5)
nRBC: 0 % (ref 0.0–0.2)

## 2021-03-06 LAB — PROTEIN / CREATININE RATIO, URINE
Creatinine, Urine: 116.3 mg/dL
Protein Creatinine Ratio: 0.21 mg/mg{Cre} — ABNORMAL HIGH (ref 0.00–0.15)
Total Protein, Urine: 24 mg/dL

## 2021-03-06 LAB — GLUCOSE, CAPILLARY: Glucose-Capillary: 113 mg/dL — ABNORMAL HIGH (ref 70–99)

## 2021-03-06 LAB — LACTATE DEHYDROGENASE: LDH: 198 U/L — ABNORMAL HIGH (ref 98–192)

## 2021-03-06 LAB — SARS CORONAVIRUS 2 (TAT 6-24 HRS): SARS Coronavirus 2: NEGATIVE

## 2021-03-06 LAB — URIC ACID: Uric Acid, Serum: 4 mg/dL (ref 2.5–7.1)

## 2021-03-06 MED ORDER — ZOLPIDEM TARTRATE 5 MG PO TABS: 5.0000 mg | ORAL_TABLET | Freq: Every evening | ORAL | Status: DC | PRN

## 2021-03-06 MED ORDER — BETAMETHASONE SOD PHOS & ACET 6 (3-3) MG/ML IJ SUSP
12.0000 mg | INTRAMUSCULAR | Status: AC
  Administered 2021-03-06 – 2021-03-07 (×2): 12 mg via INTRAMUSCULAR
  Filled 2021-03-06: qty 5

## 2021-03-06 MED ORDER — LACTATED RINGERS IV SOLN: INTRAVENOUS | Status: DC

## 2021-03-06 MED ORDER — LABETALOL HCL 5 MG/ML IV SOLN: 80.0000 mg | INTRAVENOUS | Status: DC | PRN

## 2021-03-06 MED ORDER — CALCIUM CARBONATE ANTACID 500 MG PO CHEW: 2.0000 | CHEWABLE_TABLET | ORAL | Status: DC | PRN

## 2021-03-06 MED ORDER — LABETALOL HCL 5 MG/ML IV SOLN
20.0000 mg | INTRAVENOUS | Status: DC | PRN
  Administered 2021-03-08: 20 mg via INTRAVENOUS
  Filled 2021-03-06: qty 4

## 2021-03-06 MED ORDER — DOCUSATE SODIUM 100 MG PO CAPS
100.0000 mg | ORAL_CAPSULE | Freq: Every day | ORAL | Status: DC
  Administered 2021-03-07: 100 mg via ORAL
  Filled 2021-03-06: qty 1

## 2021-03-06 MED ORDER — PRENATAL MULTIVITAMIN CH
1.0000 | ORAL_TABLET | Freq: Every day | ORAL | Status: DC
  Administered 2021-03-07: 1 via ORAL
  Filled 2021-03-06: qty 1

## 2021-03-06 MED ORDER — ACETAMINOPHEN 325 MG PO TABS: 650.0000 mg | ORAL_TABLET | ORAL | Status: DC | PRN

## 2021-03-06 MED ORDER — MAGNESIUM SULFATE BOLUS VIA INFUSION
4.0000 g | Freq: Once | INTRAVENOUS | Status: AC
  Administered 2021-03-06: 4 g via INTRAVENOUS
  Filled 2021-03-06: qty 1000

## 2021-03-06 MED ORDER — LABETALOL HCL 5 MG/ML IV SOLN: 40.0000 mg | INTRAVENOUS | Status: DC | PRN

## 2021-03-06 MED ORDER — MAGNESIUM SULFATE 40 GM/1000ML IV SOLN
2.0000 g/h | INTRAVENOUS | Status: AC
  Administered 2021-03-06 – 2021-03-09 (×4): 2 g/h via INTRAVENOUS
  Filled 2021-03-06 (×4): qty 1000

## 2021-03-06 MED ORDER — HYDRALAZINE HCL 20 MG/ML IJ SOLN: 10.0000 mg | INTRAMUSCULAR | Status: DC | PRN

## 2021-03-06 NOTE — Plan of Care (Signed)
  Problem: Education: Goal: Knowledge of General Education information will improve Description: Including pain rating scale, medication(s)/side effects and non-pharmacologic comfort measures Outcome: Completed/Met

## 2021-03-06 NOTE — H&P (Signed)
Alejandra Patel is a 40 y.o. female G6 P30 23 at 33 weeks and 5 days presenting for admission due to chronic hypertension with superimposed preeclampsia with severe features based on elevated Liver enzymes over twice normal. ALT in the office 03/04/2021 was  163. BPP in the office have been mild range 138-140/ 88-92 . Pt was diagnosed with chronic hypertension in early pregnancy due to mildly elevated bp she has never been on bp medication. Her baseline PCR  in the office was 115. PCR on 03/04/2021 was 505. Supporting diagnosis of preeclampsia. She denies headache visual disturbances or ruq pain.   Pregnancy has also been complicated by AMA , A1DM, and obesity. She has a h/o severe preeclampsia with prior pregnancy and was in ICU for a week per patient. She has been on Aspirin 81 mg daily   U/S in the office 03/21 was 4 lbs 15 oz (79%ile)   OB History    Gravida  6   Para  3   Term  3   Preterm      AB  2   Living  3     SAB  1   IAB      Ectopic  1   Multiple      Live Births  3          Past Medical History:  Diagnosis Date  . Gestational diabetes    with g2 and g3  . Headache(784.0)   . Pregnancy induced hypertension    Chronic hypertension   Past Surgical History:  Procedure Laterality Date  . CYST EXCISION  10/02/2020   Head  . TOOTH EXTRACTION  2017   Family History: family history includes Ovarian cysts in her sister. Social History:  reports that she quit smoking about 5 years ago. Her smoking use included cigars. She has never used smokeless tobacco. She reports previous alcohol use. She reports that she does not use drugs.  Medication - Aspirin 81 mg  - ferrous sulfate 325 mg daily  - cetrizine 10 mg daily  -docusate sodium 100 mg bid   Allergies  - benadryl causes swelling.      Maternal Diabetes: Yes:  Diabetes Type:  Diet controlled Genetic Screening: Declined Maternal Ultrasounds/Referrals: Normal Fetal Ultrasounds or other Referrals:   None Maternal Substance Abuse:  No Significant Maternal Medications:  None Significant Maternal Lab Results:  None Other Comments:  None  Review of Systems  Constitutional: Negative.   HENT: Negative.   Eyes: Negative.   Respiratory: Negative.   Cardiovascular: Negative.   Gastrointestinal: Negative.   Endocrine: Negative.   Genitourinary: Negative.   Musculoskeletal: Negative.   Skin: Negative.   Allergic/Immunologic: Negative.   Neurological: Negative.   Hematological: Negative.   Psychiatric/Behavioral: Negative.    History   Blood pressure (!) 145/92, pulse 88, temperature 98.7 F (37.1 C), temperature source Oral, resp. rate 19, height 5\' 7"  (1.702 m), weight 124.7 kg, last menstrual period 07/21/2020, SpO2 100 %. Maternal Exam:  Introitus: Normal vulva.   Physical Exam Vitals reviewed.  Constitutional:      Appearance: Normal appearance.  HENT:     Head: Normocephalic and atraumatic.     Nose: Nose normal.  Eyes:     Pupils: Pupils are equal, round, and reactive to light.  Cardiovascular:     Rate and Rhythm: Normal rate and regular rhythm.  Pulmonary:     Effort: Pulmonary effort is normal.     Breath sounds: Normal breath sounds.  Genitourinary:    General: Normal vulva.  Musculoskeletal:        General: Swelling present.     Cervical back: Normal range of motion and neck supple.  Skin:    General: Skin is warm.  Neurological:     General: No focal deficit present.     Mental Status: She is alert and oriented to person, place, and time.  Psychiatric:        Mood and Affect: Mood normal.        Behavior: Behavior normal.    Cervix closed thick and high.   Prenatal labs: ABO, Rh: --/--/O POS (03/30 1713) Antibody: NEG (03/30 1713) Rubella: 5.63 (11/12 1048) RPR: Non Reactive (11/12 1048)  HBsAg: Negative (11/12 1048)  HIV: Non Reactive (11/12 1048)  GBS:   Pending.   Assessment/Plan: 33 wks and 5 days with chronic hypertension and suspected  superimposed preeclampsia with severe features based on elevated liver enzymes.  -magnesium sulfate for seizure prophylaxis  - steroids for fetal lung maturity  - serial blood pressure monitoring  - MFM consult  For further management recommendations and timing of delivery.    A1 DM check cbg fasting and 2 hour postprandial  - BS may increase due to steroids  - plan for Diabetic educator consult      Gerald Leitz 03/06/2021, 6:31 PM

## 2021-03-07 DIAGNOSIS — O1493 Unspecified pre-eclampsia, third trimester: Secondary | ICD-10-CM

## 2021-03-07 DIAGNOSIS — O1503 Eclampsia in pregnancy, third trimester: Secondary | ICD-10-CM | POA: Diagnosis not present

## 2021-03-07 LAB — CBC
HCT: 36.9 % (ref 36.0–46.0)
HCT: 34.6 % — ABNORMAL LOW (ref 36.0–46.0)
Hemoglobin: 11.6 g/dL — ABNORMAL LOW (ref 12.0–15.0)
Hemoglobin: 11.2 g/dL — ABNORMAL LOW (ref 12.0–15.0)
MCH: 25.6 pg — ABNORMAL LOW (ref 26.0–34.0)
MCH: 25.9 pg — ABNORMAL LOW (ref 26.0–34.0)
MCHC: 31.4 g/dL (ref 30.0–36.0)
MCHC: 32.4 g/dL (ref 30.0–36.0)
MCV: 81.3 fL (ref 80.0–100.0)
MCV: 79.9 fL — ABNORMAL LOW (ref 80.0–100.0)
Platelets: 229 10*3/uL (ref 150–400)
Platelets: 226 K/uL (ref 150–400)
RBC: 4.54 MIL/uL (ref 3.87–5.11)
RBC: 4.33 MIL/uL (ref 3.87–5.11)
RDW: 14.4 % (ref 11.5–15.5)
RDW: 14.4 % (ref 11.5–15.5)
WBC: 11.5 10*3/uL — ABNORMAL HIGH (ref 4.0–10.5)
WBC: 11.1 K/uL — ABNORMAL HIGH (ref 4.0–10.5)
nRBC: 0 % (ref 0.0–0.2)
nRBC: 0 % (ref 0.0–0.2)

## 2021-03-07 LAB — PROTEIN, URINE, 24 HOUR
Collection Interval-UPROT: 24 hours
Protein, 24H Urine: 715 mg/d — ABNORMAL HIGH (ref 50–100)
Protein, Urine: 11 mg/dL
Urine Total Volume-UPROT: 6500 mL

## 2021-03-07 LAB — COMPREHENSIVE METABOLIC PANEL WITH GFR
ALT: 228 U/L — ABNORMAL HIGH (ref 0–44)
AST: 130 U/L — ABNORMAL HIGH (ref 15–41)
Albumin: 2.8 g/dL — ABNORMAL LOW (ref 3.5–5.0)
Alkaline Phosphatase: 153 U/L — ABNORMAL HIGH (ref 38–126)
Anion gap: 10 (ref 5–15)
BUN: 8 mg/dL (ref 6–20)
CO2: 18 mmol/L — ABNORMAL LOW (ref 22–32)
Calcium: 7.6 mg/dL — ABNORMAL LOW (ref 8.9–10.3)
Chloride: 103 mmol/L (ref 98–111)
Creatinine, Ser: 0.77 mg/dL (ref 0.44–1.00)
GFR, Estimated: >60 mL/min
Glucose, Bld: 169 mg/dL — ABNORMAL HIGH (ref 70–99)
Potassium: 4.2 mmol/L (ref 3.5–5.1)
Sodium: 131 mmol/L — ABNORMAL LOW (ref 135–145)
Total Bilirubin: 0.3 mg/dL (ref 0.3–1.2)
Total Protein: 6 g/dL — ABNORMAL LOW (ref 6.5–8.1)

## 2021-03-07 LAB — COMPREHENSIVE METABOLIC PANEL
ALT: 195 U/L — ABNORMAL HIGH (ref 0–44)
AST: 106 U/L — ABNORMAL HIGH (ref 15–41)
Albumin: 2.9 g/dL — ABNORMAL LOW (ref 3.5–5.0)
Alkaline Phosphatase: 169 U/L — ABNORMAL HIGH (ref 38–126)
Anion gap: 12 (ref 5–15)
BUN: 6 mg/dL (ref 6–20)
CO2: 19 mmol/L — ABNORMAL LOW (ref 22–32)
Calcium: 8 mg/dL — ABNORMAL LOW (ref 8.9–10.3)
Chloride: 101 mmol/L (ref 98–111)
Creatinine, Ser: 0.79 mg/dL (ref 0.44–1.00)
GFR, Estimated: >60 mL/min (ref >60)
Glucose, Bld: 106 mg/dL — ABNORMAL HIGH (ref 70–99)
Potassium: 4.1 mmol/L (ref 3.5–5.1)
Sodium: 132 mmol/L — ABNORMAL LOW (ref 135–145)
Total Bilirubin: 0.7 mg/dL (ref 0.3–1.2)
Total Protein: 6.3 g/dL — ABNORMAL LOW (ref 6.5–8.1)

## 2021-03-07 LAB — GLUCOSE, CAPILLARY
Glucose-Capillary: 110 mg/dL — ABNORMAL HIGH (ref 70–99)
Glucose-Capillary: 121 mg/dL — ABNORMAL HIGH (ref 70–99)
Glucose-Capillary: 122 mg/dL — ABNORMAL HIGH (ref 70–99)

## 2021-03-07 LAB — LACTATE DEHYDROGENASE: LDH: 229 U/L — ABNORMAL HIGH (ref 98–192)

## 2021-03-07 NOTE — Progress Notes (Signed)
Alejandra Patel is a 40 year old [id number] at 33.6 weeks Obs for [facility] with superimposed preeclampsia severe features MgSO4 infusing Bps stable, no medication required BMZ complete GDM, no meds required. Diabetic consult for BMZ and GDM complete MFM consult complete. Recommendations for IOL at [place] CBGs q 4 hours HTN labs q 1200, next draw now  Subjective: Patient reports +FM no lof no vaginal bleeding no headache visual disturbances or ruq pain.   Eating a light meal    Objective: Vitals:   03/07/21 1600 03/07/21 1700 03/07/21 1800 03/07/21 1913  BP:    130/61  Pulse:    95  Resp: 18 18 18 16   Temp:    99.2 F (37.3 C)  TempSrc:    Oral  SpO2:    100%  Weight:      Height:      .  General: alert, cooperative and no distress GI:  gravid nontender  Extremities: edema 1+ to mid calf bilaterally  FHR baseline 120 moderate variability accelerations present decelerations are absent.  TOCO no contractions  Bedside confirms VERTEX  Results for orders placed or performed during the hospital encounter of 03/06/21 (from the past 24 hour(s))  Glucose, capillary     Status: Abnormal   Collection Time: 03/06/21  9:24 PM  Result Value Ref Range   Glucose-Capillary 113 (H) 70 - 99 mg/dL  Glucose, capillary     Status: Abnormal   Collection Time: 03/07/21  5:44 AM  Result Value Ref Range   Glucose-Capillary 110 (H) 70 - 99 mg/dL  CBC     Status: Abnormal   Collection Time: 03/07/21  9:34 AM  Result Value Ref Range   WBC 11.5 (H) 4.0 - 10.5 K/uL   RBC 4.54 3.87 - 5.11 MIL/uL   Hemoglobin 11.6 (L) 12.0 - 15.0 g/dL   HCT 03/09/21 72.5 - 36.6 %   MCV 81.3 80.0 - 100.0 fL   MCH 25.6 (L) 26.0 - 34.0 pg   MCHC 31.4 30.0 - 36.0 g/dL   RDW 44.0 34.7 - 42.5 %   Platelets 229 150 - 400 K/uL   nRBC 0.0 0.0 - 0.2 %  Comprehensive metabolic panel     Status: Abnormal   Collection Time: 03/07/21  9:34 AM  Result Value Ref Range   Sodium 132 (L) 135 - 145 mmol/L   Potassium 4.1 3.5 - 5.1 mmol/L    Chloride 101 98 - 111 mmol/L   CO2 19 (L) 22 - 32 mmol/L   Glucose, Bld 106 (H) 70 - 99 mg/dL   BUN 6 6 - 20 mg/dL   Creatinine, Ser 03/09/21 0.44 - 1.00 mg/dL   Calcium 8.0 (L) 8.9 - 10.3 mg/dL   Total Protein 6.3 (L) 6.5 - 8.1 g/dL   Albumin 2.9 (L) 3.5 - 5.0 g/dL   AST 3.87 (H) 15 - 41 U/L   ALT 195 (H) 0 - 44 U/L   Alkaline Phosphatase 169 (H) 38 - 126 U/L   Total Bilirubin 0.7 0.3 - 1.2 mg/dL   GFR, Estimated 564 >33 mL/min   Anion gap 12 5 - 15  Glucose, capillary     Status: Abnormal   Collection Time: 03/07/21 12:09 PM  Result Value Ref Range   Glucose-Capillary 121 (H) 70 - 99 mg/dL  Glucose, capillary     Status: Abnormal   Collection Time: 03/07/21  7:11 PM  Result Value Ref Range   Glucose-Capillary 122 (H) 70 - 99 mg/dL  Assessment/Plan:  33 wks and 6 days with chronic hypertension with superimposed preeclampsia with severe features  - continue magensium sulfate for seizure prophylaxis  . Will plan for delivery at 34 wks induction to start at midnight  Given increasing LFT's - continue labs every 12 hours  To evaluate for HELLP syndrome  - cytotec for cervical ripening starting at midnight.  - GBS is pending if not back by induction plan penicillin for gbs prophylaxis.   A1 DM - continue CBG fasting and 2 hour postprandial until transfer to Labor and Deliver and then start every 4 hours.   Fetal Well Being - Category 1     LOS: 1 day    Alejandra Patel 03/07/2021, 8:56 PM

## 2021-03-07 NOTE — Consult Note (Signed)
MFM Consultation  Date of Service: 03/07/21  Reason for request: Chronic hypertension with super imposed preeclampsia Requesting provider: Dr. Gerald Leitz  Ms. Orchard is a 40 yo G6P3 who is here at 33w 6d with a new diagnosis of superimposed preeclampsia with severe features due to elevated LFT's and known chronic hypertension. She is seen at the request of Dr. Gerald Leitz.  Ms. Resnick is doing well. She was sent to the hospital as she had elevated blood pressure and increase proteinuria. Once at the hospital her LFT's were elevated as well. Ms. Stofer has history of preeclampsia with severe features in prior pregnancies. The prior pregnancy which was 8 years ago she was admitted to the ICU for its management. She denies headache vision changes or RUQ pain. She also denies nausea and vomiting.   She has not required IV therapy to manage her blood pressure.   Vitals with BMI 03/07/2021 03/07/2021 03/07/2021  Height - - -  Weight - 273 lbs -  BMI - 42.75 -  Systolic 138 - 143  Diastolic 75 - 80  Pulse 92 - 84  Some encounter information is confidential and restricted. Go to Review Flowsheets activity to see all data.   CBC Latest Ref Rng & Units 03/07/2021 03/06/2021 10/19/2020  WBC 4.0 - 10.5 K/uL 11.5(H) 7.4 10.3  Hemoglobin 12.0 - 15.0 g/dL 11.6(L) 11.2(L) 11.6  Hematocrit 36.0 - 46.0 % 36.9 35.5(L) 35.3  Platelets 150 - 400 K/uL 229 213 188   CMP Latest Ref Rng & Units 03/06/2021 05/17/2018 11/10/2017  Glucose 70 - 99 mg/dL 87 756(E) 90  BUN 6 - 20 mg/dL 10 15 12   Creatinine 0.44 - 1.00 mg/dL 3.32 9.51  Sodium 135 - 145 mmol/L 134(L) 138 136  Potassium 3.5 - 5.1 mmol/L 4.3 4.0 3.5  Chloride 98 - 111 mmol/L 102 107 104  CO2 22 - 32 mmol/L 23 25 26   Calcium 8.9 - 10.3 mg/dL 9.2 9.1 8.84)  Total Protein 6.5 - 8.1 g/dL 5.9(L) - -  Total Bilirubin 0.3 - 1.2 mg/dL 0.3 - -  Alkaline Phos 38 - 126 U/L 129(H) - -  AST 15 - 41 U/L 74(H) - -  ALT 0 - 44 U/L 150(H) - -   OB History  Gravida Para  Term Preterm AB Living  6 3 3  0 2 3  SAB IAB Ectopic Multiple Live Births  1 0 1 0 3    # Outcome Date GA Lbr Len/2nd Weight Sex Delivery Anes PTL Lv  6 Current           5 Term 02/03/13 [redacted]w[redacted]d 05:46 / 00:08 4414 g M Vag-Spont None  LIV     Name: Tejada,BOY Jillana     Apgar1: 8  Apgar5: 9  4 Term 06/14/09    F Vag-Spont   LIV  3 Term 09/15/06    M Vag-Spont   LIV  2 SAB           1 Ectopic              Past Medical History:  Diagnosis Date  . Gestational diabetes    with g2 and g3  . Headache(784.0)   . Pregnancy induced hypertension    Past Surgical History:  Procedure Laterality Date  . CYST EXCISION  10/02/2020   Head  . TOOTH EXTRACTION  2017   Family History  Problem Relation Age of Onset  . Ovarian cysts Sister    Current Facility-Administered Medications:  .  acetaminophen (TYLENOL) tablet 650 mg, 650 mg, Oral, Q4H PRN, Gerald Leitz, MD .  betamethasone acetate-betamethasone sodium phosphate (CELESTONE) injection 12 mg, 12 mg, Intramuscular, Q24H, Gerald Leitz, MD, 12 mg at 03/06/21 9628 .  calcium carbonate (TUMS - dosed in mg elemental calcium) chewable tablet 400 mg of elemental calcium, 2 tablet, Oral, Q4H PRN, Gerald Leitz, MD .  docusate sodium (COLACE) capsule 100 mg, 100 mg, Oral, Daily, Gerald Leitz, MD, 100 mg at 03/07/21 0908 .  labetalol (NORMODYNE) injection 20 mg, 20 mg, Intravenous, PRN **AND** labetalol (NORMODYNE) injection 40 mg, 40 mg, Intravenous, PRN **AND** labetalol (NORMODYNE) injection 80 mg, 80 mg, Intravenous, PRN **AND** hydrALAZINE (APRESOLINE) injection 10 mg, 10 mg, Intravenous, PRN **AND** Measure blood pressure, , , Once, Gerald Leitz, MD .  lactated ringers infusion, , Intravenous, Continuous, Gerald Leitz, MD, Last Rate: 50 mL/hr at 03/06/21 2011, New Bag at 03/06/21 2011 .  magnesium sulfate 40 grams in SWI 1000 mL OB infusion, 2 g/hr, Intravenous, Continuous, Gerald Leitz, MD, Last Rate: 50 mL/hr at 03/06/21 2037, 2 g/hr at 03/06/21 2037 .   prenatal multivitamin tablet 1 tablet, 1 tablet, Oral, Q1200, Gerald Leitz, MD, 1 tablet at 03/07/21 0908 .  zolpidem (AMBIEN) tablet 5 mg, 5 mg, Oral, QHS PRN, Gerald Leitz, MD   Allergies  Allergen Reactions  . Benadryl [Diphenhydramine Hcl] Swelling  . Diphenhydramine Hcl Swelling   She has received growth exams in office most recent was 2 weeks ago.   Impression/Counseling:  Chronic hypertension with superimpose preeclampsia with severe features.  I discussed with Ms. Hiegel the diagnosis, evaluation and management of preeclampsia with severe features. I reviewed the blood pressure criteria and abnormal labs. Her blood pressures have been < 160/110 overall however she has 2x normal LFT's.  She has received BMZ and is on Magnesium Sulfate. Her blood pressure remains stable.  I discussed with Ms. Headings and her significant other the increased risk for maternal stroke, worsening disease, ICU admission, stillbirth, placental abruption and cesarean delivery if we purse expectant management. Therefore we recommend initiating delivery at 34 weeks (tomorrow morning). She will receive her second dose of BMZ this evening.   We also recommend NICU consultation.  Lastly, consider labs every 8-12 hours to monitor for HELLP syndrome.  I did discuss with Ms. Blades that NSVD is likely however, given her early gestational age and need for IOL the risk for cesarean delivery is higher.  If possible consider growth exam today.  I spent 40 minute with > 50% in face to face consultation.  Novella Olive, MD.

## 2021-03-07 NOTE — Progress Notes (Addendum)
Inpatient Diabetes Program Recommendations  ADA Standards of Care 2022 Diabetes in Pregnancy Target Glucose Ranges:  Fasting: 60 - 90 mg/dL Preprandial: 60 - 945 mg/dL 1 hr postprandial: Less than 140mg /dL (from first bite of meal) 2 hr postprandial: Less than 120 mg/dL (from first bit of meal)   Lab Results  Component Value Date   GLUCAP 110 (H) 03/07/2021   HGBA1C 6.2 (H) 03/06/2021   Results for ADALAY, Alejandra Patel (MRN Jari Favre) as of 03/07/2021 09:37  Ref. Range 03/06/2021 21:24 03/07/2021 05:44  Glucose-Capillary Latest Ref Range: 70 - 99 mg/dL 03/09/2021 (H) 286 (H)   Review of Glycemic Control  Diabetes history: A1 GDM Outpatient Diabetes medications: none Current orders for Inpatient glycemic control:  None  BZM x 1 dose on 3/30 @1838   Inpatient Diabetes Program Recommendations:    Glucose trends below 120. After betamethasone (onset 10-30 minutes lasts approx 54 hours) and without any medications for glucose control recheck lunchtime with trends and will see pt. May not need to be placed on insulin possibly metformin if trends increase...  Addendum: Spoke with pt at bedside. Pt to be induced at midnight. Discussed glucose monitoring during labor. Discussed need to periodically have her A1c checked at physicals.  If glucose trends increase above 120 mg/dl consistently during labor, consider IV insulin until delivery.  Thanks,  4/30 RN, MSN, BC-ADM Inpatient Diabetes Coordinator Team Pager [phone] (8a-5p)

## 2021-03-07 NOTE — Progress Notes (Signed)
Subjective: Patient reports +FM no lof no vaginal bleeding no headache visual disturbances or ruq pain.    Objective: Vitals:   03/07/21 1100 03/07/21 1111 03/07/21 1200 03/07/21 1300  BP:  (!) 151/72    Pulse:  (!) 103    Resp: 18 18 18 18   Temp:  98.2 F (36.8 C)    TempSrc:  Oral    SpO2:  100%    Weight:      Height:      .  General: alert, cooperative and no distress GI:  gravid nontender  Extremities: edema 1+ to mid calf bilaterally  FHR baseline 120 moderate variability accelerations present decelerations are absent.  TOCO no contractions   Results for orders placed or performed during the hospital encounter of 03/06/21 (from the past 24 hour(s))  Type and screen     Status: None   Collection Time: 03/06/21  5:13 PM  Result Value Ref Range   ABO/RH(D) O POS    Antibody Screen NEG    Sample Expiration      03/09/2021,2359 Performed at [facility], [address], [place], [place]   Comprehensive metabolic panel     Status: Abnormal   Collection Time: 03/06/21  5:15 PM  Result Value Ref Range   Sodium 134 (L) 135 - 145 mmol/L   Potassium 4.3 3.5 - 5.1 mmol/L   Chloride 102 98 - 111 mmol/L   CO2 23 22 - 32 mmol/L   Glucose, Bld 87 70 - 99 mg/dL   BUN 10 6 - 20 mg/dL   Creatinine, Ser 03/08/21 0.44 - 1.00 mg/dL   Calcium 9.2 8.9 - 7.61 mg/dL   Total Protein 5.9 (L) 6.5 - 8.1 g/dL   Albumin 2.7 (L) 3.5 - 5.0 g/dL   AST 74 (H) 15 - 41 U/L   ALT 150 (H) 0 - 44 U/L   Alkaline Phosphatase 129 (H) 38 - 126 U/L   Total Bilirubin 0.3 0.3 - 1.2 mg/dL   GFR, Estimated 60.7 >37 mL/min   Anion gap 9 5 - 15  Lactate dehydrogenase     Status: Abnormal   Collection Time: 03/06/21  5:15 PM  Result Value Ref Range   LDH 198 (H) 98 - 192 U/L  Uric acid     Status: None   Collection Time: 03/06/21  5:15 PM  Result Value Ref Range   Uric Acid, Serum 4.0 2.5 - 7.1 mg/dL  Hemoglobin 03/08/21     Status: Abnormal   Collection Time: 03/06/21  5:15 PM  Result Value Ref  Range   Hgb A1c MFr Bld 6.2 (H) 4.8 - 5.6 %   Mean Plasma Glucose 131.24 mg/dL  CBC on admission     Status: Abnormal   Collection Time: 03/06/21  5:15 PM  Result Value Ref Range   WBC 7.4 4.0 - 10.5 K/uL   RBC 4.36 3.87 - 5.11 MIL/uL   Hemoglobin 11.2 (L) 12.0 - 15.0 g/dL   HCT 03/08/21 (L) 94.8 - 54.6 %   MCV 81.4 80.0 - 100.0 fL   MCH 25.7 (L) 26.0 - 34.0 pg   MCHC 31.5 30.0 - 36.0 g/dL   RDW 27.0 35.0 - 09.3 %   Platelets 213 150 - 400 K/uL   nRBC 0.0 0.0 - 0.2 %  Protein / creatinine ratio, urine     Status: Abnormal   Collection Time: 03/06/21  5:41 PM  Result Value Ref Range   Creatinine, Urine 116.30  mg/dL   Total Protein, Urine 24 mg/dL   Protein Creatinine Ratio 0.21 (H) 0.00 - 0.15 mg/mg[Cre]  Culture, beta strep (group b only)     Status: None (Preliminary result)   Collection Time: 03/06/21  5:41 PM   Specimen: Vaginal/Rectal; Genital  Result Value Ref Range   Specimen Description VAGINAL/RECTAL    Special Requests NONE    Culture      CULTURE REINCUBATED FOR BETTER GROWTH Performed at [facility], [address] [place], [place] [address]    Report Status PENDING   SARS CORONAVIRUS 2 (TAT 6-24 HRS) Nasopharyngeal Nasopharyngeal Swab     Status: None   Collection Time: 03/06/21  6:23 PM   Specimen: Nasopharyngeal Swab  Result Value Ref Range   SARS Coronavirus 2 NEGATIVE NEGATIVE  Glucose, capillary     Status: Abnormal   Collection Time: 03/06/21  9:24 PM  Result Value Ref Range   Glucose-Capillary 113 (H) 70 - 99 mg/dL  Glucose, capillary     Status: Abnormal   Collection Time: 03/07/21  5:44 AM  Result Value Ref Range   Glucose-Capillary 110 (H) 70 - 99 mg/dL  CBC     Status: Abnormal   Collection Time: 03/07/21  9:34 AM  Result Value Ref Range   WBC 11.5 (H) 4.0 - 10.5 K/uL   RBC 4.54 3.87 - 5.11 MIL/uL   Hemoglobin 11.6 (L) 12.0 - 15.0 g/dL   HCT 79.1 50.5 - 69.7 %   MCV 81.3 80.0 - 100.0 fL   MCH 25.6 (L) 26.0 - 34.0 pg   MCHC 31.4 30.0 -  36.0 g/dL   RDW 94.8 01.6 - 55.3 %   Platelets 229 150 - 400 K/uL   nRBC 0.0 0.0 - 0.2 %  Comprehensive metabolic panel     Status: Abnormal   Collection Time: 03/07/21  9:34 AM  Result Value Ref Range   Sodium 132 (L) 135 - 145 mmol/L   Potassium 4.1 3.5 - 5.1 mmol/L   Chloride 101 98 - 111 mmol/L   CO2 19 (L) 22 - 32 mmol/L   Glucose, Bld 106 (H) 70 - 99 mg/dL   BUN 6 6 - 20 mg/dL   Creatinine, Ser 7.48 0.44 - 1.00 mg/dL   Calcium 8.0 (L) 8.9 - 10.3 mg/dL   Total Protein 6.3 (L) 6.5 - 8.1 g/dL   Albumin 2.9 (L) 3.5 - 5.0 g/dL   AST 270 (H) 15 - 41 U/L   ALT 195 (H) 0 - 44 U/L   Alkaline Phosphatase 169 (H) 38 - 126 U/L   Total Bilirubin 0.7 0.3 - 1.2 mg/dL   GFR, Estimated >78 >67 mL/min   Anion gap 12 5 - 15  Glucose, capillary     Status: Abnormal   Collection Time: 03/07/21 12:09 PM  Result Value Ref Range   Glucose-Capillary 121 (H) 70 - 99 mg/dL     Assessment/Plan:  33 wks and 6 days with chronic hypertension with superimposed preeclampsia with severe features  - continue steroids for FLM  - continue magensium sulfate for seizure prophylaxis  - Appreciate MFM consult . Will plan for delivery at 34 wks induction to start at midnight  Given increasing LFT's - plan labs every 12 hours  To evaluate for HELLP syndrome  -NICU consult  - cytotec for cervical ripening starting at midnight.  - GBS is pending if not back by induction plan penicillin for gbs prophylaxis.   A1 DM - continue  CBG fasting and 2 hour postprandial until transfer to Labor and Deliver and then start every 4 hours.   Fetal Well Being - Category 1   CCOB / Dr. Normand Sloop covering after 7 pm this evening.    LOS: 1 day    Gerald Leitz 03/07/2021, 1:16 PM

## 2021-03-07 NOTE — Consult Note (Signed)
  Prenatal Consult       03/07/2021  3:22 PM   I was asked by Dr. Richardson Dopp to consult on this patient for possible preterm delivery.  I had the pleasure of meeting with Alejandra Patel today.  She is a 40 yo G6 P3023 at 33 weeks and 6 days today.  Pregnancy is complicated by superimposed Pre-Eclampsia and GDM.  Due to severe features of Pre-E, plan is for IOL tomorrow at [redacted] weeks gestation.  She has received one dose of betamethasone with plans for a second and is on magnesium.  I explained that the neonatal intensive care team would be present for the delivery and outlined the likely delivery room course for this baby including routine resuscitation and NRP-guided approaches to the treatment of respiratory distress. We discussed other common problems associated with prematurity including respiratory distress syndrome/CLD, apnea, feeding issues, temperature regulation, and infection risk.    We discussed the average length of stay but I noted that the actual LOS would depend on the severity of problems encountered and response to treatments.  We discussed visitation policies and the resources available while her child is in the hospital.  We discussed the importance of good nutrition and various methods of providing nutrition (parenteral hyperalimentation, gavage feedings and/or oral feeding). We discussed the benefits of human milk. I encouraged breast feeding and pumping soon after birth and outlined resources that are available to support breast feeding.    Thank you for involving [place] in the care of this patient. A member of our team will be available should the family have additional questions.  Time for consultation approximately 30 minutes.   _____________________ Electronically Signed By: Karie Schwalbe, MD, MS Neonatologist

## 2021-03-08 ENCOUNTER — Inpatient Hospital Stay (HOSPITAL_COMMUNITY)
Admission: AD | Admit: 2021-03-08 | Payer: Medicaid Other | Source: Home / Self Care | Admitting: Obstetrics and Gynecology

## 2021-03-08 ENCOUNTER — Inpatient Hospital Stay (HOSPITAL_COMMUNITY): Payer: Medicaid Other | Attending: Obstetrics and Gynecology

## 2021-03-08 ENCOUNTER — Encounter (HOSPITAL_COMMUNITY): Payer: Self-pay | Admitting: Obstetrics and Gynecology

## 2021-03-08 LAB — COMPREHENSIVE METABOLIC PANEL
ALT: 274 U/L — ABNORMAL HIGH (ref 0–44)
ALT: 305 U/L — ABNORMAL HIGH (ref 0–44)
AST: 154 U/L — ABNORMAL HIGH (ref 15–41)
AST: 167 U/L — ABNORMAL HIGH (ref 15–41)
Albumin: 3 g/dL — ABNORMAL LOW (ref 3.5–5.0)
Albumin: 2.9 g/dL — ABNORMAL LOW (ref 3.5–5.0)
Alkaline Phosphatase: 161 U/L — ABNORMAL HIGH (ref 38–126)
Alkaline Phosphatase: 151 U/L — ABNORMAL HIGH (ref 38–126)
Anion gap: 9 (ref 5–15)
Anion gap: 7 (ref 5–15)
BUN: 8 mg/dL (ref 6–20)
BUN: 6 mg/dL (ref 6–20)
CO2: 18 mmol/L — ABNORMAL LOW (ref 22–32)
CO2: 23 mmol/L (ref 22–32)
Calcium: 7.5 mg/dL — ABNORMAL LOW (ref 8.9–10.3)
Calcium: 7.4 mg/dL — ABNORMAL LOW (ref 8.9–10.3)
Chloride: 104 mmol/L (ref 98–111)
Chloride: 104 mmol/L (ref 98–111)
Creatinine, Ser: 0.72 mg/dL (ref 0.44–1.00)
Creatinine, Ser: 0.82 mg/dL (ref 0.44–1.00)
GFR, Estimated: >60 mL/min (ref >60)
GFR, Estimated: >60 mL/min (ref >60)
Glucose, Bld: 148 mg/dL — ABNORMAL HIGH (ref 70–99)
Glucose, Bld: 132 mg/dL — ABNORMAL HIGH (ref 70–99)
Potassium: 4 mmol/L (ref 3.5–5.1)
Potassium: 3.5 mmol/L (ref 3.5–5.1)
Sodium: 131 mmol/L — ABNORMAL LOW (ref 135–145)
Sodium: 134 mmol/L — ABNORMAL LOW (ref 135–145)
Total Bilirubin: 0.5 mg/dL (ref 0.3–1.2)
Total Bilirubin: 0.4 mg/dL (ref 0.3–1.2)
Total Protein: 6.2 g/dL — ABNORMAL LOW (ref 6.5–8.1)
Total Protein: 6.3 g/dL — ABNORMAL LOW (ref 6.5–8.1)

## 2021-03-08 LAB — GLUCOSE, CAPILLARY
Glucose-Capillary: 145 mg/dL — ABNORMAL HIGH (ref 70–99)
Glucose-Capillary: 145 mg/dL — ABNORMAL HIGH (ref 70–99)
Glucose-Capillary: 149 mg/dL — ABNORMAL HIGH (ref 70–99)
Glucose-Capillary: 141 mg/dL — ABNORMAL HIGH (ref 70–99)
Glucose-Capillary: 164 mg/dL — ABNORMAL HIGH (ref 70–99)
Glucose-Capillary: 156 mg/dL — ABNORMAL HIGH (ref 70–99)
Glucose-Capillary: 145 mg/dL — ABNORMAL HIGH (ref 70–99)
Glucose-Capillary: 144 mg/dL — ABNORMAL HIGH (ref 70–99)
Glucose-Capillary: 131 mg/dL — ABNORMAL HIGH (ref 70–99)
Glucose-Capillary: 118 mg/dL — ABNORMAL HIGH (ref 70–99)
Glucose-Capillary: 135 mg/dL — ABNORMAL HIGH (ref 70–99)
Glucose-Capillary: 145 mg/dL — ABNORMAL HIGH (ref 70–99)

## 2021-03-08 LAB — CULTURE, BETA STREP (GROUP B ONLY)

## 2021-03-08 LAB — CBC
HCT: 34.7 % — ABNORMAL LOW (ref 36.0–46.0)
HCT: 34.8 % — ABNORMAL LOW (ref 36.0–46.0)
Hemoglobin: 11.1 g/dL — ABNORMAL LOW (ref 12.0–15.0)
Hemoglobin: 11 g/dL — ABNORMAL LOW (ref 12.0–15.0)
MCH: 25.4 pg — ABNORMAL LOW (ref 26.0–34.0)
MCH: 25.5 pg — ABNORMAL LOW (ref 26.0–34.0)
MCHC: 32 g/dL (ref 30.0–36.0)
MCHC: 31.6 g/dL (ref 30.0–36.0)
MCV: 79.4 fL — ABNORMAL LOW (ref 80.0–100.0)
MCV: 80.7 fL (ref 80.0–100.0)
Platelets: 243 10*3/uL (ref 150–400)
Platelets: 226 10*3/uL (ref 150–400)
RBC: 4.37 MIL/uL (ref 3.87–5.11)
RBC: 4.31 MIL/uL (ref 3.87–5.11)
RDW: 14.7 % (ref 11.5–15.5)
RDW: 14.7 % (ref 11.5–15.5)
WBC: 12.6 10*3/uL — ABNORMAL HIGH (ref 4.0–10.5)
WBC: 11.8 10*3/uL — ABNORMAL HIGH (ref 4.0–10.5)
nRBC: 0 % (ref 0.0–0.2)
nRBC: 0 % (ref 0.0–0.2)

## 2021-03-08 LAB — LACTATE DEHYDROGENASE
LDH: 285 U/L — ABNORMAL HIGH (ref 98–192)
LDH: 286 U/L — ABNORMAL HIGH (ref 98–192)

## 2021-03-08 LAB — OB RESULTS CONSOLE GBS: GBS: NEGATIVE

## 2021-03-08 LAB — RPR: RPR Ser Ql: NONREACTIVE

## 2021-03-08 MED ORDER — INSULIN REGULAR(HUMAN) IN NACL 100-0.9 UT/100ML-% IV SOLN
INTRAVENOUS | Status: DC
  Administered 2021-03-08: 1.8 [IU]/h via INTRAVENOUS
  Filled 2021-03-08: qty 100

## 2021-03-08 MED ORDER — SOD CITRATE-CITRIC ACID 500-334 MG/5ML PO SOLN: 30.0000 mL | ORAL | Status: DC | PRN

## 2021-03-08 MED ORDER — ACETAMINOPHEN 325 MG PO TABS: 650.0000 mg | ORAL_TABLET | ORAL | Status: DC | PRN

## 2021-03-08 MED ORDER — OXYTOCIN-SODIUM CHLORIDE 30-0.9 UT/500ML-% IV SOLN
1.0000 m[IU]/min | INTRAVENOUS | Status: DC
  Administered 2021-03-08: 2 m[IU]/min via INTRAVENOUS
  Filled 2021-03-08: qty 500

## 2021-03-08 MED ORDER — LACTATED RINGERS IV SOLN: INTRAVENOUS | Status: DC

## 2021-03-08 MED ORDER — MISOPROSTOL 25 MCG QUARTER TABLET
25.0000 ug | ORAL_TABLET | ORAL | Status: DC | PRN
  Administered 2021-03-08 (×2): 25 ug via VAGINAL
  Filled 2021-03-08 (×2): qty 1

## 2021-03-08 MED ORDER — NIFEDIPINE ER OSMOTIC RELEASE 30 MG PO TB24
30.0000 mg | ORAL_TABLET | Freq: Every day | ORAL | Status: DC
  Administered 2021-03-08 – 2021-03-11 (×4): 30 mg via ORAL
  Filled 2021-03-08 (×5): qty 1

## 2021-03-08 MED ORDER — OXYTOCIN-SODIUM CHLORIDE 30-0.9 UT/500ML-% IV SOLN
2.5000 [IU]/h | INTRAVENOUS | Status: DC
  Filled 2021-03-08: qty 500

## 2021-03-08 MED ORDER — FENTANYL CITRATE (PF) 100 MCG/2ML IJ SOLN: 50.0000 ug | INTRAMUSCULAR | Status: DC | PRN

## 2021-03-08 MED ORDER — LIDOCAINE HCL (PF) 1 % IJ SOLN: 30.0000 mL | INTRAMUSCULAR | Status: DC | PRN

## 2021-03-08 MED ORDER — HYDROXYZINE HCL 50 MG PO TABS: 50.0000 mg | ORAL_TABLET | Freq: Four times a day (QID) | ORAL | Status: DC | PRN

## 2021-03-08 MED ORDER — ONDANSETRON HCL 4 MG/2ML IJ SOLN: 4.0000 mg | Freq: Four times a day (QID) | INTRAMUSCULAR | Status: DC | PRN

## 2021-03-08 MED ORDER — DEXTROSE IN LACTATED RINGERS 5 % IV SOLN: INTRAVENOUS | Status: DC

## 2021-03-08 MED ORDER — SODIUM CHLORIDE 0.9 % IV SOLN: 5.0000 10*6.[IU] | Freq: Once | INTRAVENOUS | Status: DC

## 2021-03-08 MED ORDER — PENICILLIN G POT IN DEXTROSE 60000 UNIT/ML IV SOLN
3.0000 10*6.[IU] | INTRAVENOUS | Status: DC
  Administered 2021-03-08 (×2): 3 10*6.[IU] via INTRAVENOUS
  Filled 2021-03-08 (×2): qty 50

## 2021-03-08 MED ORDER — OXYCODONE-ACETAMINOPHEN 5-325 MG PO TABS: 1.0000 | ORAL_TABLET | ORAL | Status: DC | PRN

## 2021-03-08 MED ORDER — SOD CITRATE-CITRIC ACID 500-334 MG/5ML PO SOLN
30.0000 mL | ORAL | Status: DC | PRN
  Administered 2021-03-08: 30 mL via ORAL
  Filled 2021-03-08: qty 15

## 2021-03-08 MED ORDER — DEXTROSE 50 % IV SOLN: 0.0000 mL | INTRAVENOUS | Status: DC | PRN

## 2021-03-08 MED ORDER — BUTORPHANOL TARTRATE 1 MG/ML IJ SOLN
1.0000 mg | INTRAMUSCULAR | Status: DC | PRN
  Administered 2021-03-08 – 2021-03-09 (×3): 1 mg via INTRAVENOUS
  Filled 2021-03-08 (×3): qty 1

## 2021-03-08 MED ORDER — LACTATED RINGERS IV SOLN: 500.0000 mL | INTRAVENOUS | Status: DC | PRN

## 2021-03-08 MED ORDER — MISOPROSTOL 25 MCG QUARTER TABLET: 25.0000 ug | ORAL_TABLET | ORAL | Status: DC | PRN

## 2021-03-08 MED ORDER — MISOPROSTOL 50MCG HALF TABLET
50.0000 ug | ORAL_TABLET | ORAL | Status: DC | PRN
  Administered 2021-03-08: 50 ug via ORAL
  Filled 2021-03-08: qty 1

## 2021-03-08 MED ORDER — TERBUTALINE SULFATE 1 MG/ML IJ SOLN: 0.2500 mg | Freq: Once | INTRAMUSCULAR | Status: DC | PRN

## 2021-03-08 MED ORDER — PENICILLIN G POT IN DEXTROSE 60000 UNIT/ML IV SOLN: 3.0000 10*6.[IU] | INTRAVENOUS | Status: DC

## 2021-03-08 MED ORDER — LACTATED RINGERS IV SOLN
500.0000 mL | INTRAVENOUS | Status: DC | PRN
Start: 2021-03-08 — End: 2021-03-09

## 2021-03-08 MED ORDER — OXYCODONE-ACETAMINOPHEN 5-325 MG PO TABS: 2.0000 | ORAL_TABLET | ORAL | Status: DC | PRN

## 2021-03-08 MED ORDER — OXYTOCIN BOLUS FROM INFUSION: 333.0000 mL | Freq: Once | INTRAVENOUS | Status: DC

## 2021-03-08 MED ORDER — OXYTOCIN BOLUS FROM INFUSION
333.0000 mL | Freq: Once | INTRAVENOUS | Status: AC
  Administered 2021-03-09: 333 mL via INTRAVENOUS

## 2021-03-08 MED ORDER — OXYTOCIN-SODIUM CHLORIDE 30-0.9 UT/500ML-% IV SOLN: 2.5000 [IU]/h | INTRAVENOUS | Status: DC

## 2021-03-08 MED ORDER — SODIUM CHLORIDE 0.9 % IV SOLN
5.0000 10*6.[IU] | Freq: Once | INTRAVENOUS | Status: AC
  Administered 2021-03-08: 5 10*6.[IU] via INTRAVENOUS
  Filled 2021-03-08: qty 5

## 2021-03-08 NOTE — Progress Notes (Signed)
OB Progress Note  S: Patient resting comfortably. Feeling some contractions but not very uncomfortable. Consents to AROM and IUPC.  Denies fevers, chills, chest pain, visual changes, SOB, RUQ/epigastric pain, N/V, dysuria, hematuria, or sudden onset/worsening bilateral LE or facial edema.  O: BP (!) 146/87   Pulse 81   Temp 98.3 F (36.8 C) (Oral)   Resp 18   Ht 5\' 7"  (1.702 m)   Wt 123.8 kg   LMP 07/21/2020   SpO2 98%   BMI 42.76 kg/m  Gen:  NAD, pleasant HEENT:  Mild periorbital edema Cardio:  RRR Lungs:  CTAB, no wheezes/rales/rhonchi Abd:  Soft, gravid, non-tender throughout, no epigastric/RUQ tenderness Ext:  Trace bilateral LE edema, SCDs on and working Neuro:  2+ brachial DTRs, no clonus SVE: 3/75/high AROM:  Clear, non-odorous, IUPC placed  FHT: 14bpm, moderate variablity, + accel, occasional late decelerations Toco: irregular, every 1-6 minutes  UOP:  400-1000cc/hour   Results for orders placed or performed during the hospital encounter of 03/06/21  Culture, beta strep (group b only)   Specimen: Vaginal/Rectal; Genital  Result Value Ref Range   Specimen Description VAGINAL/RECTAL    Special Requests NONE    Culture      NO GROUP B STREP (S.AGALACTIAE) ISOLATED Performed at [facility], [address] [place], [place]    Report Status 03/08/2021 FINAL   SARS CORONAVIRUS 2 (TAT 6-24 HRS) Nasopharyngeal Nasopharyngeal Swab   Specimen: Nasopharyngeal Swab  Result Value Ref Range   SARS Coronavirus 2 NEGATIVE NEGATIVE  Comprehensive metabolic panel  Result Value Ref Range   Sodium 134 (L) 135 - 145 mmol/L   Potassium 4.3 3.5 - 5.1 mmol/L   Chloride 102 98 - 111 mmol/L   CO2 23 22 - 32 mmol/L   Glucose, Bld 87 70 - 99 mg/dL   BUN 10 6 - 20 mg/dL   Creatinine, Ser 05/08/2021 0.44 - 1.00 mg/dL   Calcium 9.2 8.9 - 8.24 mg/dL   Total Protein 5.9 (L) 6.5 - 8.1 g/dL   Albumin 2.7 (L) 3.5 - 5.0 g/dL   AST 74 (H) 15 - 41 U/L   ALT 150 (H) 0 - 44 U/L    Alkaline Phosphatase 129 (H) 38 - 126 U/L   Total Bilirubin 0.3 0.3 - 1.2 mg/dL   GFR, Estimated 23.5 >36 mL/min   Anion gap 9 5 - 15  Lactate dehydrogenase  Result Value Ref Range   LDH 198 (H) 98 - 192 U/L  Uric acid  Result Value Ref Range   Uric Acid, Serum 4.0 2.5 - 7.1 mg/dL  Hemoglobin >14  Result Value Ref Range   Hgb A1c MFr Bld 6.2 (H) 4.8 - 5.6 %   Mean Plasma Glucose 131.24 mg/dL  Protein / creatinine ratio, urine  Result Value Ref Range   Creatinine, Urine 116.30 mg/dL   Total Protein, Urine 24 mg/dL   Protein Creatinine Ratio 0.21 (H) 0.00 - 0.15 mg/mg[Cre]  CBC on admission  Result Value Ref Range   WBC 7.4 4.0 - 10.5 K/uL   RBC 4.36 3.87 - 5.11 MIL/uL   Hemoglobin 11.2 (L) 12.0 - 15.0 g/dL   HCT E3X (L) 54.0 - 08.6 %   MCV 81.4 80.0 - 100.0 fL   MCH 25.7 (L) 26.0 - 34.0 pg   MCHC 31.5 30.0 - 36.0 g/dL   RDW 76.1 95.0 - 93.2 %   Platelets 213 150 - 400 K/uL   nRBC 0.0 0.0 - 0.2 %  Protein, urine, 24 hour  Result Value Ref Range   Urine Total Volume-UPROT 6,500 mL   Collection Interval-UPROT 24 hours   Protein, Urine 11 mg/dL   Protein, 30Q Urine 762 (H) 50 - 100 mg/day  Glucose, capillary  Result Value Ref Range   Glucose-Capillary 113 (H) 70 - 99 mg/dL  Glucose, capillary  Result Value Ref Range   Glucose-Capillary 110 (H) 70 - 99 mg/dL  CBC  Result Value Ref Range   WBC 11.5 (H) 4.0 - 10.5 K/uL   RBC 4.54 3.87 - 5.11 MIL/uL   Hemoglobin 11.6 (L) 12.0 - 15.0 g/dL   HCT 26.3 33.5 - 45.6 %   MCV 81.3 80.0 - 100.0 fL   MCH 25.6 (L) 26.0 - 34.0 pg   MCHC 31.4 30.0 - 36.0 g/dL   RDW 25.6 38.9 - 37.3 %   Platelets 229 150 - 400 K/uL   nRBC 0.0 0.0 - 0.2 %  Comprehensive metabolic panel  Result Value Ref Range   Sodium 132 (L) 135 - 145 mmol/L   Potassium 4.1 3.5 - 5.1 mmol/L   Chloride 101 98 - 111 mmol/L   CO2 19 (L) 22 - 32 mmol/L   Glucose, Bld 106 (H) 70 - 99 mg/dL   BUN 6 6 - 20 mg/dL   Creatinine, Ser 4.28 0.44 - 1.00 mg/dL   Calcium  8.0 (L) 8.9 - 10.3 mg/dL   Total Protein 6.3 (L) 6.5 - 8.1 g/dL   Albumin 2.9 (L) 3.5 - 5.0 g/dL   AST 768 (H) 15 - 41 U/L   ALT 195 (H) 0 - 44 U/L   Alkaline Phosphatase 169 (H) 38 - 126 U/L   Total Bilirubin 0.7 0.3 - 1.2 mg/dL   GFR, Estimated >11 >57 mL/min   Anion gap 12 5 - 15  Glucose, capillary  Result Value Ref Range   Glucose-Capillary 121 (H) 70 - 99 mg/dL  CBC  Result Value Ref Range   WBC 11.1 (H) 4.0 - 10.5 K/uL   RBC 4.33 3.87 - 5.11 MIL/uL   Hemoglobin 11.2 (L) 12.0 - 15.0 g/dL   HCT 26.2 (L) 03.5 - 59.7 %   MCV 79.9 (L) 80.0 - 100.0 fL   MCH 25.9 (L) 26.0 - 34.0 pg   MCHC 32.4 30.0 - 36.0 g/dL   RDW 41.6 38.4 - 53.6 %   Platelets 226 150 - 400 K/uL   nRBC 0.0 0.0 - 0.2 %  Comprehensive metabolic panel  Result Value Ref Range   Sodium 131 (L) 135 - 145 mmol/L   Potassium 4.2 3.5 - 5.1 mmol/L   Chloride 103 98 - 111 mmol/L   CO2 18 (L) 22 - 32 mmol/L   Glucose, Bld 169 (H) 70 - 99 mg/dL   BUN 8 6 - 20 mg/dL   Creatinine, Ser 4.68 0.44 - 1.00 mg/dL   Calcium 7.6 (L) 8.9 - 10.3 mg/dL   Total Protein 6.0 (L) 6.5 - 8.1 g/dL   Albumin 2.8 (L) 3.5 - 5.0 g/dL   AST 032 (H) 15 - 41 U/L   ALT 228 (H) 0 - 44 U/L   Alkaline Phosphatase 153 (H) 38 - 126 U/L   Total Bilirubin 0.3 0.3 - 1.2 mg/dL   GFR, Estimated >12 >24 mL/min   Anion gap 10 5 - 15  Lactate dehydrogenase  Result Value Ref Range   LDH 229 (H) 98 - 192 U/L  RPR  Result Value Ref Range   RPR  Ser Ql NON REACTIVE NON REACTIVE  Glucose, capillary  Result Value Ref Range   Glucose-Capillary 122 (H) 70 - 99 mg/dL  CBC  Result Value Ref Range   WBC 12.6 (H) 4.0 - 10.5 K/uL   RBC 4.37 3.87 - 5.11 MIL/uL   Hemoglobin 11.1 (L) 12.0 - 15.0 g/dL   HCT 17.6 (L) 16.0 - 73.7 %   MCV 79.4 (L) 80.0 - 100.0 fL   MCH 25.4 (L) 26.0 - 34.0 pg   MCHC 32.0 30.0 - 36.0 g/dL   RDW 10.6 26.9 - 48.5 %   Platelets 243 150 - 400 K/uL   nRBC 0.0 0.0 - 0.2 %  Comprehensive metabolic panel  Result Value Ref Range    Sodium 131 (L) 135 - 145 mmol/L   Potassium 4.0 3.5 - 5.1 mmol/L   Chloride 104 98 - 111 mmol/L   CO2 18 (L) 22 - 32 mmol/L   Glucose, Bld 148 (H) 70 - 99 mg/dL   BUN 8 6 - 20 mg/dL   Creatinine, Ser 4.62 0.44 - 1.00 mg/dL   Calcium 7.5 (L) 8.9 - 10.3 mg/dL   Total Protein 6.2 (L) 6.5 - 8.1 g/dL   Albumin 3.0 (L) 3.5 - 5.0 g/dL   AST 703 (H) 15 - 41 U/L   ALT 274 (H) 0 - 44 U/L   Alkaline Phosphatase 161 (H) 38 - 126 U/L   Total Bilirubin 0.5 0.3 - 1.2 mg/dL   GFR, Estimated >50 >09 mL/min   Anion gap 9 5 - 15  Lactate dehydrogenase  Result Value Ref Range   LDH 285 (H) 98 - 192 U/L  Glucose, capillary  Result Value Ref Range   Glucose-Capillary 145 (H) 70 - 99 mg/dL  Glucose, capillary  Result Value Ref Range   Glucose-Capillary 149 (H) 70 - 99 mg/dL  Glucose, capillary  Result Value Ref Range   Glucose-Capillary 141 (H) 70 - 99 mg/dL  Glucose, capillary  Result Value Ref Range   Glucose-Capillary 164 (H) 70 - 99 mg/dL  Glucose, capillary  Result Value Ref Range   Glucose-Capillary 145 (H) 70 - 99 mg/dL  Glucose, capillary  Result Value Ref Range   Glucose-Capillary 156 (H) 70 - 99 mg/dL  Glucose, capillary  Result Value Ref Range   Glucose-Capillary 145 (H) 70 - 99 mg/dL  Glucose, capillary  Result Value Ref Range   Glucose-Capillary 144 (H) 70 - 99 mg/dL  Type and screen  Result Value Ref Range   ABO/RH(D) O POS    Antibody Screen NEG    Sample Expiration      03/09/2021,2359 Performed at [facility], [address], [place], [place] [address]      A/P: 40 y.o. [id number] @ [redacted]w[redacted]d admitted for induction of labor for preeclampsia with severe features (transaminitis).  IOL - Superimposed Preeclampsia with severe features - Magnesium sulfate infusion - Medication:  Procardia 30mg  daily ordered - S/p NICU Consult - S/p BMZ x 2 doses for FLM - FWB: Cat. II - Labor course: S/p Cytotec x 3 doses, Pitocin at 84mU/min, s/p AROM - Pain: Per patient request -  GBS: Negative - Labs:  As above, HELLP labs q12h - currently in process - UOP:  Adequate, strict I&O q1 hour - Anticipate SVD  Class A1DM - HgbA1c 6.2 - Endotool ordered  AMA - Declined genetic screening - Normal ultrasounds this pregnancy  4m, DO [phone] (office)

## 2021-03-08 NOTE — Progress Notes (Signed)
Inpatient Diabetes Program Recommendations  ADA Standards of Care 2022 Diabetes in Pregnancy Target Glucose Ranges:  Fasting: 60 - 90 mg/dL Preprandial: 60 - 379 mg/dL 1 hr postprandial: Less than 140mg /dL (from first bite of meal) 2 hr postprandial: Less than 120 mg/dL (from first bit of meal)   Lab Results  Component Value Date   GLUCAP 149 (H) 03/08/2021   HGBA1C 6.2 (H) 03/06/2021    Review of Glycemic Control  Diabetes history: A1 GDM Outpatient Diabetes medications: none Current orders for Inpatient glycemic control:  None  Inpatient Diabetes Program Recommendations:    - blood glucose 140 the past 2 checks - start IV insulin  Notified RN to touch base with MD to start.  Thanks,  03/08/2021 RN, MSN, BC-ADM Inpatient Diabetes Coordinator Team Pager [phone] (8a-5p)

## 2021-03-08 NOTE — Progress Notes (Addendum)
OB Progress Note  S: Patient resting comfortably. Feeling contractions more frequently but not very uncomfortable. Voiding without issue.  Denies fevers, chills, chest pain, visual changes, SOB, RUQ/epigastric pain, N/V, dysuria, hematuria, or sudden onset/worsening bilateral LE or facial edema.  O: BP (!) 150/89   Pulse 91   Temp 98.7 F (37.1 C) (Axillary)   Resp 18   Ht 5\' 7"  (1.702 m)   Wt 123.8 kg   LMP 07/21/2020   SpO2 98%   BMI 42.76 kg/m  Gen:  NAD, pleasant HEENT:  Mild periorbital edema Cardio:  RRR Lungs:  CTAB, no wheezes/rales/rhonchi Abd:  Soft, gravid, non-tender throughout, no epigastric/RUQ tenderness Ext:  Trace bilateral LE edema, SCDs on and working Neuro:  2+ brachial DTRs, no clonus SVE: 2-3/50/high  FHT: 145bpm, moderate variablity, - accels, occasional variable and occasional late decels Toco: irregular, every 2-4 minutes  UOP:  300-500cc/hour over last ~4 hours  Results for orders placed or performed during the hospital encounter of 03/06/21  Culture, beta strep (group b only)   Specimen: Vaginal/Rectal; Genital  Result Value Ref Range   Specimen Description VAGINAL/RECTAL    Special Requests NONE    Culture      NO GROUP B STREP (S.AGALACTIAE) ISOLATED Performed at [facility], [address] [place], [place]    Report Status 03/08/2021 FINAL   SARS CORONAVIRUS 2 (TAT 6-24 HRS) Nasopharyngeal Nasopharyngeal Swab   Specimen: Nasopharyngeal Swab  Result Value Ref Range   SARS Coronavirus 2 NEGATIVE NEGATIVE  Comprehensive metabolic panel  Result Value Ref Range   Sodium 134 (L) 135 - 145 mmol/L   Potassium 4.3 3.5 - 5.1 mmol/L   Chloride 102 98 - 111 mmol/L   CO2 23 22 - 32 mmol/L   Glucose, Bld 87 70 - 99 mg/dL   BUN 10 6 - 20 mg/dL   Creatinine, Ser 05/08/2021 0.44 - 1.00 mg/dL   Calcium 9.2 8.9 - 7.16 mg/dL   Total Protein 5.9 (L) 6.5 - 8.1 g/dL   Albumin 2.7 (L) 3.5 - 5.0 g/dL   AST 74 (H) 15 - 41 U/L   ALT 150 (H) 0  - 44 U/L   Alkaline Phosphatase 129 (H) 38 - 126 U/L   Total Bilirubin 0.3 0.3 - 1.2 mg/dL   GFR, Estimated 96.7 >89 mL/min   Anion gap 9 5 - 15  Lactate dehydrogenase  Result Value Ref Range   LDH 198 (H) 98 - 192 U/L  Uric acid  Result Value Ref Range   Uric Acid, Serum 4.0 2.5 - 7.1 mg/dL  Hemoglobin >38  Result Value Ref Range   Hgb A1c MFr Bld 6.2 (H) 4.8 - 5.6 %   Mean Plasma Glucose 131.24 mg/dL  Protein / creatinine ratio, urine  Result Value Ref Range   Creatinine, Urine 116.30 mg/dL   Total Protein, Urine 24 mg/dL   Protein Creatinine Ratio 0.21 (H) 0.00 - 0.15 mg/mg[Cre]  CBC on admission  Result Value Ref Range   WBC 7.4 4.0 - 10.5 K/uL   RBC 4.36 3.87 - 5.11 MIL/uL   Hemoglobin 11.2 (L) 12.0 - 15.0 g/dL   HCT B0F (L) 75.1 - 02.5 %   MCV 81.4 80.0 - 100.0 fL   MCH 25.7 (L) 26.0 - 34.0 pg   MCHC 31.5 30.0 - 36.0 g/dL   RDW 85.2 77.8 - 24.2 %   Platelets 213 150 - 400 K/uL   nRBC 0.0 0.0 - 0.2 %  Protein, urine, 24 hour  Result Value Ref Range   Urine Total Volume-UPROT 6,500 mL   Collection Interval-UPROT 24 hours   Protein, Urine 11 mg/dL   Protein, 95A Urine 213 (H) 50 - 100 mg/day  Glucose, capillary  Result Value Ref Range   Glucose-Capillary 113 (H) 70 - 99 mg/dL  Glucose, capillary  Result Value Ref Range   Glucose-Capillary 110 (H) 70 - 99 mg/dL  CBC  Result Value Ref Range   WBC 11.5 (H) 4.0 - 10.5 K/uL   RBC 4.54 3.87 - 5.11 MIL/uL   Hemoglobin 11.6 (L) 12.0 - 15.0 g/dL   HCT 08.6 57.8 - 46.9 %   MCV 81.3 80.0 - 100.0 fL   MCH 25.6 (L) 26.0 - 34.0 pg   MCHC 31.4 30.0 - 36.0 g/dL   RDW 62.9 52.8 - 41.3 %   Platelets 229 150 - 400 K/uL   nRBC 0.0 0.0 - 0.2 %  Comprehensive metabolic panel  Result Value Ref Range   Sodium 132 (L) 135 - 145 mmol/L   Potassium 4.1 3.5 - 5.1 mmol/L   Chloride 101 98 - 111 mmol/L   CO2 19 (L) 22 - 32 mmol/L   Glucose, Bld 106 (H) 70 - 99 mg/dL   BUN 6 6 - 20 mg/dL   Creatinine, Ser 2.44 0.44 - 1.00 mg/dL    Calcium 8.0 (L) 8.9 - 10.3 mg/dL   Total Protein 6.3 (L) 6.5 - 8.1 g/dL   Albumin 2.9 (L) 3.5 - 5.0 g/dL   AST 010 (H) 15 - 41 U/L   ALT 195 (H) 0 - 44 U/L   Alkaline Phosphatase 169 (H) 38 - 126 U/L   Total Bilirubin 0.7 0.3 - 1.2 mg/dL   GFR, Estimated >27 >25 mL/min   Anion gap 12 5 - 15  Glucose, capillary  Result Value Ref Range   Glucose-Capillary 121 (H) 70 - 99 mg/dL  CBC  Result Value Ref Range   WBC 11.1 (H) 4.0 - 10.5 K/uL   RBC 4.33 3.87 - 5.11 MIL/uL   Hemoglobin 11.2 (L) 12.0 - 15.0 g/dL   HCT 36.6 (L) 44.0 - 34.7 %   MCV 79.9 (L) 80.0 - 100.0 fL   MCH 25.9 (L) 26.0 - 34.0 pg   MCHC 32.4 30.0 - 36.0 g/dL   RDW 42.5 95.6 - 38.7 %   Platelets 226 150 - 400 K/uL   nRBC 0.0 0.0 - 0.2 %  Comprehensive metabolic panel  Result Value Ref Range   Sodium 131 (L) 135 - 145 mmol/L   Potassium 4.2 3.5 - 5.1 mmol/L   Chloride 103 98 - 111 mmol/L   CO2 18 (L) 22 - 32 mmol/L   Glucose, Bld 169 (H) 70 - 99 mg/dL   BUN 8 6 - 20 mg/dL   Creatinine, Ser 5.64 0.44 - 1.00 mg/dL   Calcium 7.6 (L) 8.9 - 10.3 mg/dL   Total Protein 6.0 (L) 6.5 - 8.1 g/dL   Albumin 2.8 (L) 3.5 - 5.0 g/dL   AST 332 (H) 15 - 41 U/L   ALT 228 (H) 0 - 44 U/L   Alkaline Phosphatase 153 (H) 38 - 126 U/L   Total Bilirubin 0.3 0.3 - 1.2 mg/dL   GFR, Estimated >95 >18 mL/min   Anion gap 10 5 - 15  Lactate dehydrogenase  Result Value Ref Range   LDH 229 (H) 98 - 192 U/L  RPR  Result Value Ref Range   RPR  Ser Ql NON REACTIVE NON REACTIVE  Glucose, capillary  Result Value Ref Range   Glucose-Capillary 122 (H) 70 - 99 mg/dL  CBC  Result Value Ref Range   WBC 12.6 (H) 4.0 - 10.5 K/uL   RBC 4.37 3.87 - 5.11 MIL/uL   Hemoglobin 11.1 (L) 12.0 - 15.0 g/dL   HCT 25.0 (L) 03.7 - 04.8 %   MCV 79.4 (L) 80.0 - 100.0 fL   MCH 25.4 (L) 26.0 - 34.0 pg   MCHC 32.0 30.0 - 36.0 g/dL   RDW 88.9 16.9 - 45.0 %   Platelets 243 150 - 400 K/uL   nRBC 0.0 0.0 - 0.2 %  Comprehensive metabolic panel  Result Value Ref  Range   Sodium 131 (L) 135 - 145 mmol/L   Potassium 4.0 3.5 - 5.1 mmol/L   Chloride 104 98 - 111 mmol/L   CO2 18 (L) 22 - 32 mmol/L   Glucose, Bld 148 (H) 70 - 99 mg/dL   BUN 8 6 - 20 mg/dL   Creatinine, Ser 3.88 0.44 - 1.00 mg/dL   Calcium 7.5 (L) 8.9 - 10.3 mg/dL   Total Protein 6.2 (L) 6.5 - 8.1 g/dL   Albumin 3.0 (L) 3.5 - 5.0 g/dL   AST 828 (H) 15 - 41 U/L   ALT 274 (H) 0 - 44 U/L   Alkaline Phosphatase 161 (H) 38 - 126 U/L   Total Bilirubin 0.5 0.3 - 1.2 mg/dL   GFR, Estimated >00 >34 mL/min   Anion gap 9 5 - 15  Lactate dehydrogenase  Result Value Ref Range   LDH 285 (H) 98 - 192 U/L  Glucose, capillary  Result Value Ref Range   Glucose-Capillary 145 (H) 70 - 99 mg/dL  Glucose, capillary  Result Value Ref Range   Glucose-Capillary 149 (H) 70 - 99 mg/dL  Glucose, capillary  Result Value Ref Range   Glucose-Capillary 141 (H) 70 - 99 mg/dL  Glucose, capillary  Result Value Ref Range   Glucose-Capillary 164 (H) 70 - 99 mg/dL  Glucose, capillary  Result Value Ref Range   Glucose-Capillary 145 (H) 70 - 99 mg/dL  Glucose, capillary  Result Value Ref Range   Glucose-Capillary 156 (H) 70 - 99 mg/dL  Type and screen  Result Value Ref Range   ABO/RH(D) O POS    Antibody Screen NEG    Sample Expiration      03/09/2021,2359 Performed at [facility], [address] [place], [place] [address]      A/P: 41 y.o. [id number] @ [redacted]w[redacted]d admitted for induction of labor for preeclampsia with severe features (transaminitis).  IOL - Superimposed Preeclampsia with severe features - Magnesium sulfate infusion - Medication:  Procardia 30mg  daily ordered - S/p NICU Consult - S/p BMZ x 2 doses for FLM - FWB: Cat. II - Labor course: S/p Cytotec x 3 doses, will start Pitocin and reassess for AROM ~1730 - Pain: Per patient request - GBS: Unknown, collected on 3/30 - reincubated for better growth, currently on PCN - Labs:  As above, HELLP labs q12h  - UOP:  Adequate, strict I&O  q1 hour - Anticipate SVD  Class A1DM - HgbA1c 6.2 - Endotool ordered  AMA - Declined genetic screening - Normal ultrasounds this pregnancy  4/30, DO [phone] (office)

## 2021-03-08 NOTE — Progress Notes (Addendum)
OB Progress Note  S: Patient resting comfortably. Not really feeling many contractions at this time. Significant other at bedside and getting ready to assist patient to bathroom to void.   Denies fevers, chills, chest pain, visual changes, SOB, RUQ/epigastric pain, N/V, dysuria, hematuria, or sudden onset/worsening bilateral LE or facial edema.  O: BP 134/85 (BP Location: Right Arm)   Pulse 91   Temp 98.7 F (37.1 C) (Axillary)   Resp 18   Ht 5\' 7"  (1.702 m)   Wt 123.8 kg   LMP 07/21/2020   SpO2 98%   BMI 42.76 kg/m  Gen:  NAD, pleasant HEENT:  Mild periorbital edema Cardio:  RRR Lungs:  CTAB, no wheezes/rales/rhonchi Abd:  Soft, gravid, non-tender throughout, no epigastric/RUQ tenderness Ext:  Trace bilateral LE edema, SCDs on and working Neuro:  2+ brachial DTRs, no clonus SVE: deferred (recent check by primary RN 1/70/ballotable at 0534)  FHT: 135bpm, moderate variablity, + accels, - decels Toco: irregular, occasional  UOP:  >500cc/hour  Results for orders placed or performed during the hospital encounter of 03/06/21  Culture, beta strep (group b only)   Specimen: Vaginal/Rectal; Genital  Result Value Ref Range   Specimen Description VAGINAL/RECTAL    Special Requests NONE    Culture      CULTURE REINCUBATED FOR BETTER GROWTH Performed at [facility], [address] [place], [place]    Report Status PENDING   SARS CORONAVIRUS 2 (TAT 6-24 HRS) Nasopharyngeal Nasopharyngeal Swab   Specimen: Nasopharyngeal Swab  Result Value Ref Range   SARS Coronavirus 2 NEGATIVE NEGATIVE  Comprehensive metabolic panel  Result Value Ref Range   Sodium 134 (L) 135 - 145 mmol/L   Potassium 4.3 3.5 - 5.1 mmol/L   Chloride 102 98 - 111 mmol/L   CO2 23 22 - 32 mmol/L   Glucose, Bld 87 70 - 99 mg/dL   BUN 10 6 - 20 mg/dL   Creatinine, Ser 67672 0.44 - 1.00 mg/dL   Calcium 9.2 8.9 - 0.94 mg/dL   Total Protein 5.9 (L) 6.5 - 8.1 g/dL   Albumin 2.7 (L) 3.5 - 5.0 g/dL    AST 74 (H) 15 - 41 U/L   ALT 150 (H) 0 - 44 U/L   Alkaline Phosphatase 129 (H) 38 - 126 U/L   Total Bilirubin 0.3 0.3 - 1.2 mg/dL   GFR, Estimated 70.9 >62 mL/min   Anion gap 9 5 - 15  Lactate dehydrogenase  Result Value Ref Range   LDH 198 (H) 98 - 192 U/L  Uric acid  Result Value Ref Range   Uric Acid, Serum 4.0 2.5 - 7.1 mg/dL  Hemoglobin >83  Result Value Ref Range   Hgb A1c MFr Bld 6.2 (H) 4.8 - 5.6 %   Mean Plasma Glucose 131.24 mg/dL  Protein / creatinine ratio, urine  Result Value Ref Range   Creatinine, Urine 116.30 mg/dL   Total Protein, Urine 24 mg/dL   Protein Creatinine Ratio 0.21 (H) 0.00 - 0.15 mg/mg[Cre]  CBC on admission  Result Value Ref Range   WBC 7.4 4.0 - 10.5 K/uL   RBC 4.36 3.87 - 5.11 MIL/uL   Hemoglobin 11.2 (L) 12.0 - 15.0 g/dL   HCT M6Q (L) 94.7 - 65.4 %   MCV 81.4 80.0 - 100.0 fL   MCH 25.7 (L) 26.0 - 34.0 pg   MCHC 31.5 30.0 - 36.0 g/dL   RDW 65.0 35.4 - 65.6 %   Platelets 213 150 -  400 K/uL   nRBC 0.0 0.0 - 0.2 %  Protein, urine, 24 hour  Result Value Ref Range   Urine Total Volume-UPROT 6,500 mL   Collection Interval-UPROT 24 hours   Protein, Urine 11 mg/dL   Protein, 67E Urine 720 (H) 50 - 100 mg/day  Glucose, capillary  Result Value Ref Range   Glucose-Capillary 113 (H) 70 - 99 mg/dL  Glucose, capillary  Result Value Ref Range   Glucose-Capillary 110 (H) 70 - 99 mg/dL  CBC  Result Value Ref Range   WBC 11.5 (H) 4.0 - 10.5 K/uL   RBC 4.54 3.87 - 5.11 MIL/uL   Hemoglobin 11.6 (L) 12.0 - 15.0 g/dL   HCT 94.7 09.6 - 28.3 %   MCV 81.3 80.0 - 100.0 fL   MCH 25.6 (L) 26.0 - 34.0 pg   MCHC 31.4 30.0 - 36.0 g/dL   RDW 66.2 94.7 - 65.4 %   Platelets 229 150 - 400 K/uL   nRBC 0.0 0.0 - 0.2 %  Comprehensive metabolic panel  Result Value Ref Range   Sodium 132 (L) 135 - 145 mmol/L   Potassium 4.1 3.5 - 5.1 mmol/L   Chloride 101 98 - 111 mmol/L   CO2 19 (L) 22 - 32 mmol/L   Glucose, Bld 106 (H) 70 - 99 mg/dL   BUN 6 6 - 20 mg/dL    Creatinine, Ser 6.50 0.44 - 1.00 mg/dL   Calcium 8.0 (L) 8.9 - 10.3 mg/dL   Total Protein 6.3 (L) 6.5 - 8.1 g/dL   Albumin 2.9 (L) 3.5 - 5.0 g/dL   AST 354 (H) 15 - 41 U/L   ALT 195 (H) 0 - 44 U/L   Alkaline Phosphatase 169 (H) 38 - 126 U/L   Total Bilirubin 0.7 0.3 - 1.2 mg/dL   GFR, Estimated >65 >68 mL/min   Anion gap 12 5 - 15  Glucose, capillary  Result Value Ref Range   Glucose-Capillary 121 (H) 70 - 99 mg/dL  CBC  Result Value Ref Range   WBC 11.1 (H) 4.0 - 10.5 K/uL   RBC 4.33 3.87 - 5.11 MIL/uL   Hemoglobin 11.2 (L) 12.0 - 15.0 g/dL   HCT 12.7 (L) 51.7 - 00.1 %   MCV 79.9 (L) 80.0 - 100.0 fL   MCH 25.9 (L) 26.0 - 34.0 pg   MCHC 32.4 30.0 - 36.0 g/dL   RDW 74.9 44.9 - 67.5 %   Platelets 226 150 - 400 K/uL   nRBC 0.0 0.0 - 0.2 %  Comprehensive metabolic panel  Result Value Ref Range   Sodium 131 (L) 135 - 145 mmol/L   Potassium 4.2 3.5 - 5.1 mmol/L   Chloride 103 98 - 111 mmol/L   CO2 18 (L) 22 - 32 mmol/L   Glucose, Bld 169 (H) 70 - 99 mg/dL   BUN 8 6 - 20 mg/dL   Creatinine, Ser 9.16 0.44 - 1.00 mg/dL   Calcium 7.6 (L) 8.9 - 10.3 mg/dL   Total Protein 6.0 (L) 6.5 - 8.1 g/dL   Albumin 2.8 (L) 3.5 - 5.0 g/dL   AST 384 (H) 15 - 41 U/L   ALT 228 (H) 0 - 44 U/L   Alkaline Phosphatase 153 (H) 38 - 126 U/L   Total Bilirubin 0.3 0.3 - 1.2 mg/dL   GFR, Estimated >66 >59 mL/min   Anion gap 10 5 - 15  Lactate dehydrogenase  Result Value Ref Range   LDH 229 (H) 98 - 192  U/L  Glucose, capillary  Result Value Ref Range   Glucose-Capillary 122 (H) 70 - 99 mg/dL  Glucose, capillary  Result Value Ref Range   Glucose-Capillary 145 (H) 70 - 99 mg/dL  Glucose, capillary  Result Value Ref Range   Glucose-Capillary 149 (H) 70 - 99 mg/dL  Type and screen  Result Value Ref Range   ABO/RH(D) O POS    Antibody Screen NEG    Sample Expiration      03/09/2021,2359 Performed at [facility], [address], [place], [place] [address]      A/P: 40 y.o. [id number] @  [redacted]w[redacted]d admitted for induction of labor for preeclampsia with severe features (transaminitis).  IOL - Superimposed Preeclampsia with severe features - Magnesium sulfate infusion - S/p NICU Consult - S/p BMZ x 2 doses for FLM - FWB: Cat. 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

## 2021-03-08 NOTE — Progress Notes (Signed)
OB Progress Note  S: Patient breathing through contractions now.  O: BP 130/84   Pulse 77   Temp 98.3 F (36.8 C) (Oral)   Resp 17   Ht 5\' 7"  (1.702 m)   Wt 123.8 kg   LMP 07/21/2020   SpO2 98%   BMI 42.76 kg/m   FHT: 145bpm, minimal to moderate variablity, + accels, - decels Toco: q 1-2 minutes, IUPC readjusted SVE: 3/80/-3, sutures palpated  A/P: 40 y.o. 20 @ [redacted]w[redacted]d admitted for IOL for superimposed preeclampsia with severe features (transaminitis).  FWB: Cat. II Labor course: Pitocin at 45mU/min Pain: Per patient request GBS: Negative Anticipate SVD  18m, DO [phone] (office)

## 2021-03-08 NOTE — Progress Notes (Signed)
OB Progress Note  Patient reports feeling a little more discomfort but not very uncomfortable at this time. FHR 140bpm, moderate variability, + accel, - decelerations.  Contraction irregular. Will return in ~2 hours for repeat cervical exam.  [name], DO

## 2021-03-09 ENCOUNTER — Encounter (HOSPITAL_COMMUNITY): Payer: Self-pay | Admitting: Obstetrics and Gynecology

## 2021-03-09 DIAGNOSIS — Z3481 Encounter for supervision of other normal pregnancy, first trimester: Secondary | ICD-10-CM | POA: Diagnosis not present

## 2021-03-09 DIAGNOSIS — Z3A34 34 weeks gestation of pregnancy: Secondary | ICD-10-CM | POA: Diagnosis not present

## 2021-03-09 LAB — COMPREHENSIVE METABOLIC PANEL
ALT: 297 U/L — ABNORMAL HIGH (ref 0–44)
ALT: 350 U/L — ABNORMAL HIGH (ref 0–44)
AST: 150 U/L — ABNORMAL HIGH (ref 15–41)
AST: 165 U/L — ABNORMAL HIGH (ref 15–41)
Albumin: 2.4 g/dL — ABNORMAL LOW (ref 3.5–5.0)
Albumin: 2.8 g/dL — ABNORMAL LOW (ref 3.5–5.0)
Alkaline Phosphatase: 123 U/L (ref 38–126)
Alkaline Phosphatase: 142 U/L — ABNORMAL HIGH (ref 38–126)
Anion gap: 6 (ref 5–15)
Anion gap: 5 (ref 5–15)
BUN: 5 mg/dL — ABNORMAL LOW (ref 6–20)
BUN: 7 mg/dL (ref 6–20)
CO2: 22 mmol/L (ref 22–32)
CO2: 27 mmol/L (ref 22–32)
Calcium: 7 mg/dL — ABNORMAL LOW (ref 8.9–10.3)
Calcium: 7.5 mg/dL — ABNORMAL LOW (ref 8.9–10.3)
Chloride: 106 mmol/L (ref 98–111)
Chloride: 104 mmol/L (ref 98–111)
Creatinine, Ser: 0.79 mg/dL (ref 0.44–1.00)
Creatinine, Ser: 0.85 mg/dL (ref 0.44–1.00)
GFR, Estimated: >60 mL/min (ref >60)
GFR, Estimated: >60 mL/min (ref >60)
Glucose, Bld: 124 mg/dL — ABNORMAL HIGH (ref 70–99)
Glucose, Bld: 95 mg/dL (ref 70–99)
Potassium: 3.8 mmol/L (ref 3.5–5.1)
Potassium: 3.8 mmol/L (ref 3.5–5.1)
Sodium: 134 mmol/L — ABNORMAL LOW (ref 135–145)
Sodium: 136 mmol/L (ref 135–145)
Total Bilirubin: 0.3 mg/dL (ref 0.3–1.2)
Total Bilirubin: 0.3 mg/dL (ref 0.3–1.2)
Total Protein: 5.2 g/dL — ABNORMAL LOW (ref 6.5–8.1)
Total Protein: 6.1 g/dL — ABNORMAL LOW (ref 6.5–8.1)

## 2021-03-09 LAB — CBC
HCT: 32.3 % — ABNORMAL LOW (ref 36.0–46.0)
HCT: 36.4 % (ref 36.0–46.0)
Hemoglobin: 10.2 g/dL — ABNORMAL LOW (ref 12.0–15.0)
Hemoglobin: 11.4 g/dL — ABNORMAL LOW (ref 12.0–15.0)
MCH: 25.9 pg — ABNORMAL LOW (ref 26.0–34.0)
MCH: 25.7 pg — ABNORMAL LOW (ref 26.0–34.0)
MCHC: 31.6 g/dL (ref 30.0–36.0)
MCHC: 31.3 g/dL (ref 30.0–36.0)
MCV: 82 fL (ref 80.0–100.0)
MCV: 82.2 fL (ref 80.0–100.0)
Platelets: 212 10*3/uL (ref 150–400)
Platelets: 263 10*3/uL (ref 150–400)
RBC: 3.94 MIL/uL (ref 3.87–5.11)
RBC: 4.43 MIL/uL (ref 3.87–5.11)
RDW: 15.3 % (ref 11.5–15.5)
RDW: 15.3 % (ref 11.5–15.5)
WBC: 13.4 10*3/uL — ABNORMAL HIGH (ref 4.0–10.5)
WBC: 14.6 10*3/uL — ABNORMAL HIGH (ref 4.0–10.5)
nRBC: 0 % (ref 0.0–0.2)
nRBC: 0.1 % (ref 0.0–0.2)

## 2021-03-09 LAB — GLUCOSE, CAPILLARY
Glucose-Capillary: 114 mg/dL — ABNORMAL HIGH (ref 70–99)
Glucose-Capillary: 122 mg/dL — ABNORMAL HIGH (ref 70–99)
Glucose-Capillary: 124 mg/dL — ABNORMAL HIGH (ref 70–99)
Glucose-Capillary: 128 mg/dL — ABNORMAL HIGH (ref 70–99)
Glucose-Capillary: 129 mg/dL — ABNORMAL HIGH (ref 70–99)

## 2021-03-09 LAB — LACTATE DEHYDROGENASE
LDH: 267 U/L — ABNORMAL HIGH (ref 98–192)
LDH: 304 U/L — ABNORMAL HIGH (ref 98–192)

## 2021-03-09 MED ORDER — COCONUT OIL OIL
1.0000 "application " | TOPICAL_OIL | Status: DC | PRN
  Administered 2021-03-10: 1 via TOPICAL

## 2021-03-09 MED ORDER — ALBUTEROL SULFATE (2.5 MG/3ML) 0.083% IN NEBU: 2.5000 mg | INHALATION_SOLUTION | Freq: Four times a day (QID) | RESPIRATORY_TRACT | Status: DC | PRN

## 2021-03-09 MED ORDER — ONDANSETRON HCL 4 MG PO TABS: 4.0000 mg | ORAL_TABLET | ORAL | Status: DC | PRN

## 2021-03-09 MED ORDER — PRENATAL MULTIVITAMIN CH
1.0000 | ORAL_TABLET | Freq: Every day | ORAL | Status: DC
  Administered 2021-03-09 – 2021-03-11 (×3): 1 via ORAL
  Filled 2021-03-09 (×3): qty 1

## 2021-03-09 MED ORDER — WITCH HAZEL-GLYCERIN EX PADS: 1.0000 "application " | MEDICATED_PAD | CUTANEOUS | Status: DC | PRN

## 2021-03-09 MED ORDER — LACTATED RINGERS IV SOLN: INTRAVENOUS | Status: DC

## 2021-03-09 MED ORDER — IBUPROFEN 600 MG PO TABS: 600.0000 mg | ORAL_TABLET | Freq: Three times a day (TID) | ORAL | Status: DC | PRN

## 2021-03-09 MED ORDER — ALBUTEROL SULFATE HFA 108 (90 BASE) MCG/ACT IN AERS: 1.0000 | INHALATION_SPRAY | Freq: Four times a day (QID) | RESPIRATORY_TRACT | Status: DC | PRN

## 2021-03-09 MED ORDER — ONDANSETRON HCL 4 MG/2ML IJ SOLN: 4.0000 mg | INTRAMUSCULAR | Status: DC | PRN

## 2021-03-09 MED ORDER — DIBUCAINE (PERIANAL) 1 % EX OINT: 1.0000 "application " | TOPICAL_OINTMENT | CUTANEOUS | Status: DC | PRN

## 2021-03-09 MED ORDER — SIMETHICONE 80 MG PO CHEW: 80.0000 mg | CHEWABLE_TABLET | ORAL | Status: DC | PRN

## 2021-03-09 MED ORDER — SENNOSIDES-DOCUSATE SODIUM 8.6-50 MG PO TABS
2.0000 | ORAL_TABLET | Freq: Every day | ORAL | Status: DC
  Administered 2021-03-10 – 2021-03-11 (×2): 2 via ORAL
  Filled 2021-03-09 (×2): qty 2

## 2021-03-09 MED ORDER — ZOLPIDEM TARTRATE 5 MG PO TABS: 5.0000 mg | ORAL_TABLET | Freq: Every evening | ORAL | Status: DC | PRN

## 2021-03-09 MED ORDER — BENZOCAINE-MENTHOL 20-0.5 % EX AERO: 1.0000 "application " | INHALATION_SPRAY | CUTANEOUS | Status: DC | PRN

## 2021-03-09 MED ORDER — ACETAMINOPHEN 325 MG PO TABS
650.0000 mg | ORAL_TABLET | ORAL | Status: DC | PRN
  Administered 2021-03-09: 650 mg via ORAL
  Filled 2021-03-09: qty 2

## 2021-03-09 NOTE — Lactation Note (Signed)
This note was copied from a baby's chart. Lactation Consultation Note  Patient Name: Alejandra Patel Date: 03/09/2021 Reason for consult: Initial assessment;NICU baby;Late-preterm 34-36.6wks;Infant < 6lbs;Maternal endocrine disorder Age:39 hours  LC in to visit with P4 Mom of LPTI in the NICU.  Mom with chronic hypertension and preeclampsia, on MgSO4 currently, and A1DM, AMA and obesity.  Mom is experienced breastfeeding her 3 other babies for a year.  RN set up DEBP and assisted Mom with first pumping.  2 ml colostrum collected by syringe and FOB took colostrum to NICU.  Will request breast milk labels.  Demonstrated how to disassemble pump parts, wash, rinse and place in separate bin to dry.  Mom encouraged to pump every 2-3 hrs during the day and 3-4 hrs at night.  Mom is exhausted from MgSO4.  Mom states she requested a DEBP (Lansinoh) from her insurance company yesterday.  Mom aware of pump rental from gift shop and DEBP available to her in baby's room.   NICU booklet and lactation brochure left in room. Mom fell asleep and will need more education on follow-up from [facility].  Maternal Data Has patient been taught Hand Expression?: Yes Does the patient have breastfeeding experience prior to this delivery?: Yes How long did the patient breastfeed?: 1 year with 3 prior babies  Feeding Mother's Current Feeding Choice: Breast Milk and Donor Milk  Lactation Tools Discussed/Used Tools: Pump;Flanges Flange Size: 24 Breast pump type: Double-Electric Breast Pump Pump Education: Setup, frequency, and cleaning;Milk Storage Reason for Pumping: LPTI in NICU Pumping frequency: Q3 hr Pumped volume: 2 mL  Discharge Pump: Advised to call insurance company;Refer for rental (Ordered DEBP (Lansinoh) on 03/08/21) WIC Program: No  Consult Status Consult Status: Follow-up Date: 03/10/21 Follow-up type: In-patient    [name] 03/09/2021, 10:12 AM

## 2021-03-09 NOTE — Progress Notes (Addendum)
Postpartum Note Day #0  S:  Patient doing well.  Pain controlled.  Tolerating regular diet.   Ambulating and voiding without difficulty. She is about to pump and then visit her infant in the NICU.  Denies fevers, chills, chest pain, visual changes, SOB, RUQ/epigastric pain, N/V, dysuria, hematuria, or sudden onset/worsening bilateral LE or facial edema.  Lochia: Minimal Infant feeding:  Pumping Circumcision:  N/A, female infant Contraception:  Not discussed today  O: Temp:  [97.8 F (36.6 C)-98.3 F (36.8 C)] 97.8 F (36.6 C) (04/02 0644) Pulse Rate:  [66-95] 69 (04/02 0644) Resp:  [16-18] 17 (04/02 0800) BP: (130-160)/(65-105) 133/78 (04/02 0644) SpO2:  [99 %] 99 % (04/02 0644) Gen: NAD, pleasant and cooperative HEENT:  Mild periorbital edema CV: RRR Resp: CTAB, no wheezes/rales/rhonchi Abdomen: soft, non-distended, non-tender throughout Uterus: firm, non-tender, below umbilicus Ext: Trace bilateral LE edema, no bilateral calf tenderness Neuro:  2+ brachial DTRs, no clonus bilaterally  Results for orders placed or performed during the hospital encounter of 03/06/21  Culture, beta strep (group b only)   Specimen: Vaginal/Rectal; Genital  Result Value Ref Range   Specimen Description VAGINAL/RECTAL    Special Requests NONE    Culture      NO GROUP B STREP (S.AGALACTIAE) ISOLATED Performed at [facility], [address] [place], [place] [address]    Report Status 03/08/2021 FINAL   SARS CORONAVIRUS 2 (TAT 6-24 HRS) Nasopharyngeal Nasopharyngeal Swab   Specimen: Nasopharyngeal Swab  Result Value Ref Range   SARS Coronavirus 2 NEGATIVE NEGATIVE  OB RESULT CONSOLE Group B Strep  Result Value Ref Range   GBS Negative   Comprehensive metabolic panel  Result Value Ref Range   Sodium 134 (L) 135 - 145 mmol/L   Potassium 4.3 3.5 - 5.1 mmol/L   Chloride 102 98 - 111 mmol/L   CO2 23 22 - 32 mmol/L   Glucose, Bld 87 70 - 99 mg/dL   BUN 10 6 - 20 mg/dL   Creatinine,  Ser 3.38 0.44 - 1.00 mg/dL   Calcium 9.2 8.9 - 25.0 mg/dL   Total Protein 5.9 (L) 6.5 - 8.1 g/dL   Albumin 2.7 (L) 3.5 - 5.0 g/dL   AST 74 (H) 15 - 41 U/L   ALT 150 (H) 0 - 44 U/L   Alkaline Phosphatase 129 (H) 38 - 126 U/L   Total Bilirubin 0.3 0.3 - 1.2 mg/dL   GFR, Estimated >53 >97 mL/min   Anion gap 9 5 - 15  Lactate dehydrogenase  Result Value Ref Range   LDH 198 (H) 98 - 192 U/L  Uric acid  Result Value Ref Range   Uric Acid, Serum 4.0 2.5 - 7.1 mg/dL  Hemoglobin Q7H  Result Value Ref Range   Hgb A1c MFr Bld 6.2 (H) 4.8 - 5.6 %   Mean Plasma Glucose 131.24 mg/dL  Protein / creatinine ratio, urine  Result Value Ref Range   Creatinine, Urine 116.30 mg/dL   Total Protein, Urine 24 mg/dL   Protein Creatinine Ratio 0.21 (H) 0.00 - 0.15 mg/mg[Cre]  CBC on admission  Result Value Ref Range   WBC 7.4 4.0 - 10.5 K/uL   RBC 4.36 3.87 - 5.11 MIL/uL   Hemoglobin 11.2 (L) 12.0 - 15.0 g/dL   HCT 41.9 (L) 37.9 - 02.4 %   MCV 81.4 80.0 - 100.0 fL   MCH 25.7 (L) 26.0 - 34.0 pg   MCHC 31.5 30.0 - 36.0 g/dL   RDW 14.3  11.5 - 15.5 %   Platelets 213 150 - 400 K/uL   nRBC 0.0 0.0 - 0.2 %  Protein, urine, 24 hour  Result Value Ref Range   Urine Total Volume-UPROT 6,500 mL   Collection Interval-UPROT 24 hours   Protein, Urine 11 mg/dL   Protein, 40J24H Urine 811715 (H) 50 - 100 mg/day  Glucose, capillary  Result Value Ref Range   Glucose-Capillary 113 (H) 70 - 99 mg/dL  Glucose, capillary  Result Value Ref Range   Glucose-Capillary 110 (H) 70 - 99 mg/dL  CBC  Result Value Ref Range   WBC 11.5 (H) 4.0 - 10.5 K/uL   RBC 4.54 3.87 - 5.11 MIL/uL   Hemoglobin 11.6 (L) 12.0 - 15.0 g/dL   HCT 91.436.9 78.236.0 - 95.646.0 %   MCV 81.3 80.0 - 100.0 fL   MCH 25.6 (L) 26.0 - 34.0 pg   MCHC 31.4 30.0 - 36.0 g/dL   RDW 21.314.4 08.611.5 - 57.815.5 %   Platelets 229 150 - 400 K/uL   nRBC 0.0 0.0 - 0.2 %  Comprehensive metabolic panel  Result Value Ref Range   Sodium 132 (L) 135 - 145 mmol/L   Potassium 4.1 3.5 -  5.1 mmol/L   Chloride 101 98 - 111 mmol/L   CO2 19 (L) 22 - 32 mmol/L   Glucose, Bld 106 (H) 70 - 99 mg/dL   BUN 6 6 - 20 mg/dL   Creatinine, Ser 4.690.79 0.44 - 1.00 mg/dL   Calcium 8.0 (L) 8.9 - 10.3 mg/dL   Total Protein 6.3 (L) 6.5 - 8.1 g/dL   Albumin 2.9 (L) 3.5 - 5.0 g/dL   AST 629106 (H) 15 - 41 U/L   ALT 195 (H) 0 - 44 U/L   Alkaline Phosphatase 169 (H) 38 - 126 U/L   Total Bilirubin 0.7 0.3 - 1.2 mg/dL   GFR, Estimated >52>60 >84>60 mL/min   Anion gap 12 5 - 15  Glucose, capillary  Result Value Ref Range   Glucose-Capillary 121 (H) 70 - 99 mg/dL  CBC  Result Value Ref Range   WBC 11.1 (H) 4.0 - 10.5 K/uL   RBC 4.33 3.87 - 5.11 MIL/uL   Hemoglobin 11.2 (L) 12.0 - 15.0 g/dL   HCT 13.234.6 (L) 44.036.0 - 10.246.0 %   MCV 79.9 (L) 80.0 - 100.0 fL   MCH 25.9 (L) 26.0 - 34.0 pg   MCHC 32.4 30.0 - 36.0 g/dL   RDW 72.514.4 36.611.5 - 44.015.5 %   Platelets 226 150 - 400 K/uL   nRBC 0.0 0.0 - 0.2 %  Comprehensive metabolic panel  Result Value Ref Range   Sodium 131 (L) 135 - 145 mmol/L   Potassium 4.2 3.5 - 5.1 mmol/L   Chloride 103 98 - 111 mmol/L   CO2 18 (L) 22 - 32 mmol/L   Glucose, Bld 169 (H) 70 - 99 mg/dL   BUN 8 6 - 20 mg/dL   Creatinine, Ser 3.470.77 0.44 - 1.00 mg/dL   Calcium 7.6 (L) 8.9 - 10.3 mg/dL   Total Protein 6.0 (L) 6.5 - 8.1 g/dL   Albumin 2.8 (L) 3.5 - 5.0 g/dL   AST 425130 (H) 15 - 41 U/L   ALT 228 (H) 0 - 44 U/L   Alkaline Phosphatase 153 (H) 38 - 126 U/L   Total Bilirubin 0.3 0.3 - 1.2 mg/dL   GFR, Estimated >95>60 >63>60 mL/min   Anion gap 10 5 - 15  Lactate dehydrogenase  Result Value  Ref Range   LDH 229 (H) 98 - 192 U/L  RPR  Result Value Ref Range   RPR Ser Ql NON REACTIVE NON REACTIVE  Glucose, capillary  Result Value Ref Range   Glucose-Capillary 122 (H) 70 - 99 mg/dL  CBC  Result Value Ref Range   WBC 12.6 (H) 4.0 - 10.5 K/uL   RBC 4.37 3.87 - 5.11 MIL/uL   Hemoglobin 11.1 (L) 12.0 - 15.0 g/dL   HCT 50.9 (L) 32.6 - 71.2 %   MCV 79.4 (L) 80.0 - 100.0 fL   MCH 25.4 (L)  26.0 - 34.0 pg   MCHC 32.0 30.0 - 36.0 g/dL   RDW 45.8 09.9 - 83.3 %   Platelets 243 150 - 400 K/uL   nRBC 0.0 0.0 - 0.2 %  Comprehensive metabolic panel  Result Value Ref Range   Sodium 131 (L) 135 - 145 mmol/L   Potassium 4.0 3.5 - 5.1 mmol/L   Chloride 104 98 - 111 mmol/L   CO2 18 (L) 22 - 32 mmol/L   Glucose, Bld 148 (H) 70 - 99 mg/dL   BUN 8 6 - 20 mg/dL   Creatinine, Ser 8.25 0.44 - 1.00 mg/dL   Calcium 7.5 (L) 8.9 - 10.3 mg/dL   Total Protein 6.2 (L) 6.5 - 8.1 g/dL   Albumin 3.0 (L) 3.5 - 5.0 g/dL   AST 053 (H) 15 - 41 U/L   ALT 274 (H) 0 - 44 U/L   Alkaline Phosphatase 161 (H) 38 - 126 U/L   Total Bilirubin 0.5 0.3 - 1.2 mg/dL   GFR, Estimated >97 >67 mL/min   Anion gap 9 5 - 15  Lactate dehydrogenase  Result Value Ref Range   LDH 285 (H) 98 - 192 U/L  CBC  Result Value Ref Range   WBC 11.8 (H) 4.0 - 10.5 K/uL   RBC 4.31 3.87 - 5.11 MIL/uL   Hemoglobin 11.0 (L) 12.0 - 15.0 g/dL   HCT 34.1 (L) 93.7 - 90.2 %   MCV 80.7 80.0 - 100.0 fL   MCH 25.5 (L) 26.0 - 34.0 pg   MCHC 31.6 30.0 - 36.0 g/dL   RDW 40.9 73.5 - 32.9 %   Platelets 226 150 - 400 K/uL   nRBC 0.0 0.0 - 0.2 %  Comprehensive metabolic panel  Result Value Ref Range   Sodium 134 (L) 135 - 145 mmol/L   Potassium 3.5 3.5 - 5.1 mmol/L   Chloride 104 98 - 111 mmol/L   CO2 23 22 - 32 mmol/L   Glucose, Bld 132 (H) 70 - 99 mg/dL   BUN 6 6 - 20 mg/dL   Creatinine, Ser 9.24 0.44 - 1.00 mg/dL   Calcium 7.4 (L) 8.9 - 10.3 mg/dL   Total Protein 6.3 (L) 6.5 - 8.1 g/dL   Albumin 2.9 (L) 3.5 - 5.0 g/dL   AST 268 (H) 15 - 41 U/L   ALT 305 (H) 0 - 44 U/L   Alkaline Phosphatase 151 (H) 38 - 126 U/L   Total Bilirubin 0.4 0.3 - 1.2 mg/dL   GFR, Estimated >34 >19 mL/min   Anion gap 7 5 - 15  Lactate dehydrogenase  Result Value Ref Range   LDH 286 (H) 98 - 192 U/L  Glucose, capillary  Result Value Ref Range   Glucose-Capillary 145 (H) 70 - 99 mg/dL  Glucose, capillary  Result Value Ref Range   Glucose-Capillary  149 (H) 70 - 99 mg/dL  Glucose, capillary  Result Value Ref Range   Glucose-Capillary 141 (H) 70 - 99 mg/dL  Glucose, capillary  Result Value Ref Range   Glucose-Capillary 164 (H) 70 - 99 mg/dL  Glucose, capillary  Result Value Ref Range   Glucose-Capillary 145 (H) 70 - 99 mg/dL  Glucose, capillary  Result Value Ref Range   Glucose-Capillary 156 (H) 70 - 99 mg/dL  Glucose, capillary  Result Value Ref Range   Glucose-Capillary 145 (H) 70 - 99 mg/dL  Glucose, capillary  Result Value Ref Range   Glucose-Capillary 144 (H) 70 - 99 mg/dL  CBC  Result Value Ref Range   WBC 13.4 (H) 4.0 - 10.5 K/uL   RBC 3.94 3.87 - 5.11 MIL/uL   Hemoglobin 10.2 (L) 12.0 - 15.0 g/dL   HCT 91.4 (L) 78.2 - 95.6 %   MCV 82.0 80.0 - 100.0 fL   MCH 25.9 (L) 26.0 - 34.0 pg   MCHC 31.6 30.0 - 36.0 g/dL   RDW 21.3 08.6 - 57.8 %   Platelets 212 150 - 400 K/uL   nRBC 0.0 0.0 - 0.2 %  Comprehensive metabolic panel  Result Value Ref Range   Sodium 134 (L) 135 - 145 mmol/L   Potassium 3.8 3.5 - 5.1 mmol/L   Chloride 106 98 - 111 mmol/L   CO2 22 22 - 32 mmol/L   Glucose, Bld 124 (H) 70 - 99 mg/dL   BUN 5 (L) 6 - 20 mg/dL   Creatinine, Ser 4.69 0.44 - 1.00 mg/dL   Calcium 7.0 (L) 8.9 - 10.3 mg/dL   Total Protein 5.2 (L) 6.5 - 8.1 g/dL   Albumin 2.4 (L) 3.5 - 5.0 g/dL   AST 629 (H) 15 - 41 U/L   ALT 297 (H) 0 - 44 U/L   Alkaline Phosphatase 123 38 - 126 U/L   Total Bilirubin 0.3 0.3 - 1.2 mg/dL   GFR, Estimated >52 >84 mL/min   Anion gap 6 5 - 15  Lactate dehydrogenase  Result Value Ref Range   LDH 267 (H) 98 - 192 U/L  Glucose, capillary  Result Value Ref Range   Glucose-Capillary 131 (H) 70 - 99 mg/dL  Glucose, capillary  Result Value Ref Range   Glucose-Capillary 118 (H) 70 - 99 mg/dL  Glucose, capillary  Result Value Ref Range   Glucose-Capillary 135 (H) 70 - 99 mg/dL  Glucose, capillary  Result Value Ref Range   Glucose-Capillary 145 (H) 70 - 99 mg/dL  Glucose, capillary  Result Value  Ref Range   Glucose-Capillary 129 (H) 70 - 99 mg/dL  Glucose, capillary  Result Value Ref Range   Glucose-Capillary 128 (H) 70 - 99 mg/dL  Glucose, capillary  Result Value Ref Range   Glucose-Capillary 122 (H) 70 - 99 mg/dL  Glucose, capillary  Result Value Ref Range   Glucose-Capillary 124 (H) 70 - 99 mg/dL  Type and screen  Result Value Ref Range   ABO/RH(D) O POS    Antibody Screen NEG    Sample Expiration      03/09/2021,2359 Performed at [facility], [address] [place], [place] [address]     A/P: Patient is a 40 y.o. [id number] PPD#0 s/p SVD (IOL for SI preeclampsia with SF).  S/p SVD - Pain well controlled  - GU: UOP is adequate - GI: Tolerating regular diet - Activity: encouraged sitting up to chair and ambulation as tolerated - DVT Prophylaxis: Frequent ambulation, SCDs in bed - Labs: stable as above  Superimposed  Preeclampsia with severe features - Magnesium sulfate infusion x 24 hours postpartum - Medication:  Procardia 30mg  daily  - Labs:  As above, HELLP labs q12h - transaminitis improving - UOP:  Adequate, strict I&O q1 hour - Will need 1 week BP check postpartum  A1DM - BS postpartum 124 on CMP - Will need testing for overt diabetes at 6 week PPV  Disposition:  D/C home PPD#2   , DO [phone] (office)

## 2021-03-10 LAB — COMPREHENSIVE METABOLIC PANEL
ALT: 283 U/L — ABNORMAL HIGH (ref 0–44)
AST: 117 U/L — ABNORMAL HIGH (ref 15–41)
Albumin: 2.4 g/dL — ABNORMAL LOW (ref 3.5–5.0)
Alkaline Phosphatase: 120 U/L (ref 38–126)
Anion gap: 5 (ref 5–15)
BUN: 6 mg/dL (ref 6–20)
CO2: 27 mmol/L (ref 22–32)
Calcium: 7.4 mg/dL — ABNORMAL LOW (ref 8.9–10.3)
Chloride: 105 mmol/L (ref 98–111)
Creatinine, Ser: 0.92 mg/dL (ref 0.44–1.00)
GFR, Estimated: >60 mL/min (ref >60)
Glucose, Bld: 84 mg/dL (ref 70–99)
Potassium: 3.8 mmol/L (ref 3.5–5.1)
Sodium: 137 mmol/L (ref 135–145)
Total Bilirubin: 0.2 mg/dL — ABNORMAL LOW (ref 0.3–1.2)
Total Protein: 5.2 g/dL — ABNORMAL LOW (ref 6.5–8.1)

## 2021-03-10 LAB — CBC
HCT: 32.1 % — ABNORMAL LOW (ref 36.0–46.0)
Hemoglobin: 10 g/dL — ABNORMAL LOW (ref 12.0–15.0)
MCH: 25.3 pg — ABNORMAL LOW (ref 26.0–34.0)
MCHC: 31.2 g/dL (ref 30.0–36.0)
MCV: 81.1 fL (ref 80.0–100.0)
Platelets: 228 10*3/uL (ref 150–400)
RBC: 3.96 MIL/uL (ref 3.87–5.11)
RDW: 15.1 % (ref 11.5–15.5)
WBC: 11.7 10*3/uL — ABNORMAL HIGH (ref 4.0–10.5)
nRBC: 0 % (ref 0.0–0.2)

## 2021-03-10 LAB — GLUCOSE, CAPILLARY
Glucose-Capillary: 82 mg/dL (ref 70–99)
Glucose-Capillary: 79 mg/dL (ref 70–99)
Glucose-Capillary: 110 mg/dL — ABNORMAL HIGH (ref 70–99)
Glucose-Capillary: 106 mg/dL — ABNORMAL HIGH (ref 70–99)

## 2021-03-10 LAB — LACTATE DEHYDROGENASE: LDH: 245 U/L — ABNORMAL HIGH (ref 98–192)

## 2021-03-10 MED ORDER — NIFEDIPINE ER 30 MG PO TB24: 30.0000 mg | ORAL_TABLET | Freq: Every day | ORAL | 3 refills | Status: AC

## 2021-03-10 MED ORDER — IBUPROFEN 800 MG PO TABS: 800.0000 mg | ORAL_TABLET | Freq: Three times a day (TID) | ORAL | 0 refills | Status: AC | PRN

## 2021-03-10 NOTE — Progress Notes (Addendum)
Postpartum Note Day #1  S:  Patient doing well.  Pain controlled.  Tolerating regular diet.   Ambulating and voiding without difficulty. She was able to nurse her infant in the NICU.   Denies fevers, chills, chest pain, visual changes, SOB, RUQ/epigastric pain, N/V, dysuria, hematuria, or sudden onset/worsening bilateral LE or facial edema.  Lochia: Minimal Infant feeding:  Pumping Circumcision:  N/A, female infant Contraception:  Considering bilateral tubal sterilization  O: Temp:  [97.4 F (36.3 C)-98.9 F (37.2 C)] 98.9 F (37.2 C) (04/03 0746) Pulse Rate:  [66-80] 66 (04/03 0746) Resp:  [16-20] 18 (04/03 0746) BP: (126-144)/(72-93) 131/88 (04/03 0746) SpO2:  [98 %-100 %] 99 % (04/03 0746) Weight:  [115.7 kg-122.5 kg] 122.5 kg (04/03 0500) Gen: NAD, pleasant and cooperative HEENT:  Mild periorbital edema - improving CV: RRR Resp: CTAB, no wheezes/rales/rhonchi Abdomen: soft, non-distended, non-tender throughout Uterus: firm, non-tender, below umbilicus Ext: Trace bilateral LE edema, no bilateral calf tenderness Neuro:  2+ brachial DTRs, no clonus bilaterally  Results for orders placed or performed during the hospital encounter of 03/06/21  Culture, beta strep (group b only)   Specimen: Vaginal/Rectal; Genital  Result Value Ref Range   Specimen Description VAGINAL/RECTAL    Special Requests NONE    Culture      NO GROUP B STREP (S.AGALACTIAE) ISOLATED Performed at [facility], [address] [place], [place] [address]    Report Status 03/08/2021 FINAL   SARS CORONAVIRUS 2 (TAT 6-24 HRS) Nasopharyngeal Nasopharyngeal Swab   Specimen: Nasopharyngeal Swab  Result Value Ref Range   SARS Coronavirus 2 NEGATIVE NEGATIVE  OB RESULT CONSOLE Group B Strep  Result Value Ref Range   GBS Negative   Comprehensive metabolic panel  Result Value Ref Range   Sodium 134 (L) 135 - 145 mmol/L   Potassium 4.3 3.5 - 5.1 mmol/L   Chloride 102 98 - 111 mmol/L   CO2 23 22 - 32  mmol/L   Glucose, Bld 87 70 - 99 mg/dL   BUN 10 6 - 20 mg/dL   Creatinine, Ser 8.36 0.44 - 1.00 mg/dL   Calcium 9.2 8.9 - 62.9 mg/dL   Total Protein 5.9 (L) 6.5 - 8.1 g/dL   Albumin 2.7 (L) 3.5 - 5.0 g/dL   AST 74 (H) 15 - 41 U/L   ALT 150 (H) 0 - 44 U/L   Alkaline Phosphatase 129 (H) 38 - 126 U/L   Total Bilirubin 0.3 0.3 - 1.2 mg/dL   GFR, Estimated >47 >65 mL/min   Anion gap 9 5 - 15  Lactate dehydrogenase  Result Value Ref Range   LDH 198 (H) 98 - 192 U/L  Uric acid  Result Value Ref Range   Uric Acid, Serum 4.0 2.5 - 7.1 mg/dL  Hemoglobin Y6T  Result Value Ref Range   Hgb A1c MFr Bld 6.2 (H) 4.8 - 5.6 %   Mean Plasma Glucose 131.24 mg/dL  Protein / creatinine ratio, urine  Result Value Ref Range   Creatinine, Urine 116.30 mg/dL   Total Protein, Urine 24 mg/dL   Protein Creatinine Ratio 0.21 (H) 0.00 - 0.15 mg/mg[Cre]  CBC on admission  Result Value Ref Range   WBC 7.4 4.0 - 10.5 K/uL   RBC 4.36 3.87 - 5.11 MIL/uL   Hemoglobin 11.2 (L) 12.0 - 15.0 g/dL   HCT 03.5 (L) 46.5 - 68.1 %   MCV 81.4 80.0 - 100.0 fL   MCH 25.7 (L) 26.0 - 34.0 pg  MCHC 31.5 30.0 - 36.0 g/dL   RDW 64.3 32.9 - 51.8 %   Platelets 213 150 - 400 K/uL   nRBC 0.0 0.0 - 0.2 %  Protein, urine, 24 hour  Result Value Ref Range   Urine Total Volume-UPROT 6,500 mL   Collection Interval-UPROT 24 hours   Protein, Urine 11 mg/dL   Protein, 84Z Urine 660 (H) 50 - 100 mg/day  Glucose, capillary  Result Value Ref Range   Glucose-Capillary 113 (H) 70 - 99 mg/dL  Glucose, capillary  Result Value Ref Range   Glucose-Capillary 110 (H) 70 - 99 mg/dL  CBC  Result Value Ref Range   WBC 11.5 (H) 4.0 - 10.5 K/uL   RBC 4.54 3.87 - 5.11 MIL/uL   Hemoglobin 11.6 (L) 12.0 - 15.0 g/dL   HCT 63.0 16.0 - 10.9 %   MCV 81.3 80.0 - 100.0 fL   MCH 25.6 (L) 26.0 - 34.0 pg   MCHC 31.4 30.0 - 36.0 g/dL   RDW 32.3 55.7 - 32.2 %   Platelets 229 150 - 400 K/uL   nRBC 0.0 0.0 - 0.2 %  Comprehensive metabolic panel   Result Value Ref Range   Sodium 132 (L) 135 - 145 mmol/L   Potassium 4.1 3.5 - 5.1 mmol/L   Chloride 101 98 - 111 mmol/L   CO2 19 (L) 22 - 32 mmol/L   Glucose, Bld 106 (H) 70 - 99 mg/dL   BUN 6 6 - 20 mg/dL   Creatinine, Ser 0.25 0.44 - 1.00 mg/dL   Calcium 8.0 (L) 8.9 - 10.3 mg/dL   Total Protein 6.3 (L) 6.5 - 8.1 g/dL   Albumin 2.9 (L) 3.5 - 5.0 g/dL   AST 427 (H) 15 - 41 U/L   ALT 195 (H) 0 - 44 U/L   Alkaline Phosphatase 169 (H) 38 - 126 U/L   Total Bilirubin 0.7 0.3 - 1.2 mg/dL   GFR, Estimated >06 >23 mL/min   Anion gap 12 5 - 15  Glucose, capillary  Result Value Ref Range   Glucose-Capillary 121 (H) 70 - 99 mg/dL  CBC  Result Value Ref Range   WBC 11.1 (H) 4.0 - 10.5 K/uL   RBC 4.33 3.87 - 5.11 MIL/uL   Hemoglobin 11.2 (L) 12.0 - 15.0 g/dL   HCT 76.2 (L) 83.1 - 51.7 %   MCV 79.9 (L) 80.0 - 100.0 fL   MCH 25.9 (L) 26.0 - 34.0 pg   MCHC 32.4 30.0 - 36.0 g/dL   RDW 61.6 07.3 - 71.0 %   Platelets 226 150 - 400 K/uL   nRBC 0.0 0.0 - 0.2 %  Comprehensive metabolic panel  Result Value Ref Range   Sodium 131 (L) 135 - 145 mmol/L   Potassium 4.2 3.5 - 5.1 mmol/L   Chloride 103 98 - 111 mmol/L   CO2 18 (L) 22 - 32 mmol/L   Glucose, Bld 169 (H) 70 - 99 mg/dL   BUN 8 6 - 20 mg/dL   Creatinine, Ser 6.26 0.44 - 1.00 mg/dL   Calcium 7.6 (L) 8.9 - 10.3 mg/dL   Total Protein 6.0 (L) 6.5 - 8.1 g/dL   Albumin 2.8 (L) 3.5 - 5.0 g/dL   AST 948 (H) 15 - 41 U/L   ALT 228 (H) 0 - 44 U/L   Alkaline Phosphatase 153 (H) 38 - 126 U/L   Total Bilirubin 0.3 0.3 - 1.2 mg/dL   GFR, Estimated >54 >62 mL/min   Anion gap  10 5 - 15  Lactate dehydrogenase  Result Value Ref Range   LDH 229 (H) 98 - 192 U/L  RPR  Result Value Ref Range   RPR Ser Ql NON REACTIVE NON REACTIVE  Glucose, capillary  Result Value Ref Range   Glucose-Capillary 122 (H) 70 - 99 mg/dL  CBC  Result Value Ref Range   WBC 12.6 (H) 4.0 - 10.5 K/uL   RBC 4.37 3.87 - 5.11 MIL/uL   Hemoglobin 11.1 (L) 12.0 - 15.0  g/dL   HCT 16.1 (L) 09.6 - 04.5 %   MCV 79.4 (L) 80.0 - 100.0 fL   MCH 25.4 (L) 26.0 - 34.0 pg   MCHC 32.0 30.0 - 36.0 g/dL   RDW 40.9 81.1 - 91.4 %   Platelets 243 150 - 400 K/uL   nRBC 0.0 0.0 - 0.2 %  Comprehensive metabolic panel  Result Value Ref Range   Sodium 131 (L) 135 - 145 mmol/L   Potassium 4.0 3.5 - 5.1 mmol/L   Chloride 104 98 - 111 mmol/L   CO2 18 (L) 22 - 32 mmol/L   Glucose, Bld 148 (H) 70 - 99 mg/dL   BUN 8 6 - 20 mg/dL   Creatinine, Ser 7.82 0.44 - 1.00 mg/dL   Calcium 7.5 (L) 8.9 - 10.3 mg/dL   Total Protein 6.2 (L) 6.5 - 8.1 g/dL   Albumin 3.0 (L) 3.5 - 5.0 g/dL   AST 956 (H) 15 - 41 U/L   ALT 274 (H) 0 - 44 U/L   Alkaline Phosphatase 161 (H) 38 - 126 U/L   Total Bilirubin 0.5 0.3 - 1.2 mg/dL   GFR, Estimated >21 >30 mL/min   Anion gap 9 5 - 15  Lactate dehydrogenase  Result Value Ref Range   LDH 285 (H) 98 - 192 U/L  CBC  Result Value Ref Range   WBC 11.8 (H) 4.0 - 10.5 K/uL   RBC 4.31 3.87 - 5.11 MIL/uL   Hemoglobin 11.0 (L) 12.0 - 15.0 g/dL   HCT 86.5 (L) 78.4 - 69.6 %   MCV 80.7 80.0 - 100.0 fL   MCH 25.5 (L) 26.0 - 34.0 pg   MCHC 31.6 30.0 - 36.0 g/dL   RDW 29.5 28.4 - 13.2 %   Platelets 226 150 - 400 K/uL   nRBC 0.0 0.0 - 0.2 %  Comprehensive metabolic panel  Result Value Ref Range   Sodium 134 (L) 135 - 145 mmol/L   Potassium 3.5 3.5 - 5.1 mmol/L   Chloride 104 98 - 111 mmol/L   CO2 23 22 - 32 mmol/L   Glucose, Bld 132 (H) 70 - 99 mg/dL   BUN 6 6 - 20 mg/dL   Creatinine, Ser 4.40 0.44 - 1.00 mg/dL   Calcium 7.4 (L) 8.9 - 10.3 mg/dL   Total Protein 6.3 (L) 6.5 - 8.1 g/dL   Albumin 2.9 (L) 3.5 - 5.0 g/dL   AST 102 (H) 15 - 41 U/L   ALT 305 (H) 0 - 44 U/L   Alkaline Phosphatase 151 (H) 38 - 126 U/L   Total Bilirubin 0.4 0.3 - 1.2 mg/dL   GFR, Estimated >72 >53 mL/min   Anion gap 7 5 - 15  Lactate dehydrogenase  Result Value Ref Range   LDH 286 (H) 98 - 192 U/L  Glucose, capillary  Result Value Ref Range   Glucose-Capillary 145  (H) 70 - 99 mg/dL  Glucose, capillary  Result Value Ref Range   Glucose-Capillary  149 (H) 70 - 99 mg/dL  Glucose, capillary  Result Value Ref Range   Glucose-Capillary 141 (H) 70 - 99 mg/dL  Glucose, capillary  Result Value Ref Range   Glucose-Capillary 164 (H) 70 - 99 mg/dL  Glucose, capillary  Result Value Ref Range   Glucose-Capillary 145 (H) 70 - 99 mg/dL  Glucose, capillary  Result Value Ref Range   Glucose-Capillary 156 (H) 70 - 99 mg/dL  Glucose, capillary  Result Value Ref Range   Glucose-Capillary 145 (H) 70 - 99 mg/dL  Glucose, capillary  Result Value Ref Range   Glucose-Capillary 144 (H) 70 - 99 mg/dL  CBC  Result Value Ref Range   WBC 13.4 (H) 4.0 - 10.5 K/uL   RBC 3.94 3.87 - 5.11 MIL/uL   Hemoglobin 10.2 (L) 12.0 - 15.0 g/dL   HCT 09.832.3 (L) 11.936.0 - 14.746.0 %   MCV 82.0 80.0 - 100.0 fL   MCH 25.9 (L) 26.0 - 34.0 pg   MCHC 31.6 30.0 - 36.0 g/dL   RDW 82.915.3 56.211.5 - 13.015.5 %   Platelets 212 150 - 400 K/uL   nRBC 0.0 0.0 - 0.2 %  Comprehensive metabolic panel  Result Value Ref Range   Sodium 134 (L) 135 - 145 mmol/L   Potassium 3.8 3.5 - 5.1 mmol/L   Chloride 106 98 - 111 mmol/L   CO2 22 22 - 32 mmol/L   Glucose, Bld 124 (H) 70 - 99 mg/dL   BUN 5 (L) 6 - 20 mg/dL   Creatinine, Ser 8.650.79 0.44 - 1.00 mg/dL   Calcium 7.0 (L) 8.9 - 10.3 mg/dL   Total Protein 5.2 (L) 6.5 - 8.1 g/dL   Albumin 2.4 (L) 3.5 - 5.0 g/dL   AST 784150 (H) 15 - 41 U/L   ALT 297 (H) 0 - 44 U/L   Alkaline Phosphatase 123 38 - 126 U/L   Total Bilirubin 0.3 0.3 - 1.2 mg/dL   GFR, Estimated >69>60 >62>60 mL/min   Anion gap 6 5 - 15  Lactate dehydrogenase  Result Value Ref Range   LDH 267 (H) 98 - 192 U/L  Glucose, capillary  Result Value Ref Range   Glucose-Capillary 131 (H) 70 - 99 mg/dL  Glucose, capillary  Result Value Ref Range   Glucose-Capillary 118 (H) 70 - 99 mg/dL  Glucose, capillary  Result Value Ref Range   Glucose-Capillary 135 (H) 70 - 99 mg/dL  CBC  Result Value Ref Range   WBC 14.6  (H) 4.0 - 10.5 K/uL   RBC 4.43 3.87 - 5.11 MIL/uL   Hemoglobin 11.4 (L) 12.0 - 15.0 g/dL   HCT 95.236.4 84.136.0 - 32.446.0 %   MCV 82.2 80.0 - 100.0 fL   MCH 25.7 (L) 26.0 - 34.0 pg   MCHC 31.3 30.0 - 36.0 g/dL   RDW 40.115.3 02.711.5 - 25.315.5 %   Platelets 263 150 - 400 K/uL   nRBC 0.1 0.0 - 0.2 %  Comprehensive metabolic panel  Result Value Ref Range   Sodium 136 135 - 145 mmol/L   Potassium 3.8 3.5 - 5.1 mmol/L   Chloride 104 98 - 111 mmol/L   CO2 27 22 - 32 mmol/L   Glucose, Bld 95 70 - 99 mg/dL   BUN 7 6 - 20 mg/dL   Creatinine, Ser 6.640.85 0.44 - 1.00 mg/dL   Calcium 7.5 (L) 8.9 - 10.3 mg/dL   Total Protein 6.1 (L) 6.5 - 8.1 g/dL   Albumin 2.8 (L) 3.5 -  5.0 g/dL   AST 619 (H) 15 - 41 U/L   ALT 350 (H) 0 - 44 U/L   Alkaline Phosphatase 142 (H) 38 - 126 U/L   Total Bilirubin 0.3 0.3 - 1.2 mg/dL   GFR, Estimated >50 >93 mL/min   Anion gap 5 5 - 15  Lactate dehydrogenase  Result Value Ref Range   LDH 304 (H) 98 - 192 U/L  Glucose, capillary  Result Value Ref Range   Glucose-Capillary 145 (H) 70 - 99 mg/dL  Glucose, capillary  Result Value Ref Range   Glucose-Capillary 129 (H) 70 - 99 mg/dL  Glucose, capillary  Result Value Ref Range   Glucose-Capillary 128 (H) 70 - 99 mg/dL  Glucose, capillary  Result Value Ref Range   Glucose-Capillary 122 (H) 70 - 99 mg/dL  Glucose, capillary  Result Value Ref Range   Glucose-Capillary 124 (H) 70 - 99 mg/dL  Glucose, capillary  Result Value Ref Range   Glucose-Capillary 114 (H) 70 - 99 mg/dL   Comment 1 Notify RN    Comment 2 Document in Chart   CBC  Result Value Ref Range   WBC 11.7 (H) 4.0 - 10.5 K/uL   RBC 3.96 3.87 - 5.11 MIL/uL   Hemoglobin 10.0 (L) 12.0 - 15.0 g/dL   HCT 26.7 (L) 12.4 - 58.0 %   MCV 81.1 80.0 - 100.0 fL   MCH 25.3 (L) 26.0 - 34.0 pg   MCHC 31.2 30.0 - 36.0 g/dL   RDW 99.8 33.8 - 25.0 %   Platelets 228 150 - 400 K/uL   nRBC 0.0 0.0 - 0.2 %  Glucose, capillary  Result Value Ref Range   Glucose-Capillary 82 70 - 99  mg/dL  Type and screen  Result Value Ref Range   ABO/RH(D) O POS    Antibody Screen NEG    Sample Expiration      03/09/2021,2359 Performed at [facility], [address], [place], [place] [address]     A/P: Patient is a 40 y.o. [id number] PPD#1 s/p SVD (IOL for SI preeclampsia with SF).  S/p SVD - Pain well controlled  - GU: UOP is adequate - GI: Tolerating regular diet - Activity: encouraged sitting up to chair and ambulation as tolerated - DVT Prophylaxis: Frequent ambulation, SCDs in bed - Labs: stable as above  Superimposed Preeclampsia with severe features - S/p Mag sulfate x 24 hours postpartum - Medication:  Procardia 30mg  daily  - Blood pressures:  Normal to low mild range - Labs:  As above, HELLP labs q12h - transaminitis stable, current in process - UOP:  Adequate, voiding spontaneously - Will need 1 week BP check postpartum  A1DM - BS postpartum 124 on CMP - Will need testing for overt diabetes at 6 week PPV  Disposition:  D/C home PPD#2   , DO [phone] (office)

## 2021-03-10 NOTE — Lactation Note (Signed)
This note was copied from a baby's chart. Lactation Consultation Note  Patient Name: Alejandra Patel [id number] Date: 03/10/2021 Reason for consult: Follow-up assessment;NICU baby;Late-preterm 34-36.6wks Age:39 hours    RN requested LC to observe feeding attempt.  Mom placed infant STS.  Infant showed no cues to feed.  Mom attempted to hand express to entice infant.    Mom states infant had previously cued and latched successfully with good sucking at the breast.    This was moms third breastfeeding attempt.    LC reviewed LPTI behavior.  Mom was encouraged to continue pumping every 2-3 hours with no longer than a 4 hour stretch at night.    Mom was praised for her pumping efforts and attempts to breastfeed.     Maternal Data    Feeding Mother's Current Feeding Choice: Breast Milk and Donor Milk  LATCH Score Latch: Too sleepy or reluctant, no latch achieved, no sucking elicited.  Audible Swallowing: None  Type of Nipple: Everted at rest and after stimulation  Comfort (Breast/Nipple): Soft / non-tender  Hold (Positioning): Assistance needed to correctly position infant at breast and maintain latch.  LATCH Score: 5   Lactation Tools Discussed/Used Breast pump type: Double-Electric Breast Pump Reason for Pumping: LPTI Pumped volume: 5 mL  Interventions Interventions: Breast feeding basics reviewed;Assisted with latch;Skin to skin;Breast massage;Hand express;Support pillows;Adjust position  Discharge Pump: DEBP  Consult Status Consult Status: Follow-up Date: 03/11/21 Follow-up type: In-patient    Maryruth Hancock [facility] 03/10/2021, 3:42 PM

## 2021-03-10 NOTE — Lactation Note (Signed)
This note was copied from a baby's chart. Lactation Consultation Note  Patient Name: Alejandra Patel CHYIF'O Date: 03/10/2021   Age:39 hours   Mom and dad eating breakfast.  Mom has BF infant and feels the baby latched well and mom felt a strong tug.  [redacted] wk gestation behavior reviewed with mom and dad.  Mom feels much better today and is off of Mag.    Mom states she isn't able to get colostrum from the left side.  LC offered to assist with hand expression after mom eats. Mom does desire help.She is planning and resting and then will call out for lactation.  She desires lactation to observe infant at the breast in NICU and understands she can notify baby's RN to set up NICU appointments.   LC will follow up with mom later today to review hand expression.  LC reviewed pump settings and encouraged mom to pump every 2-3 hours allowing a longer stretch, no more than 4 hours, at night for rest.   Boxes drawn on board for mom to check off when she completes a pumping session.  All questions answered and family was congratulated on the birth of their daughter.   Maternal Data    Feeding    LATCH Score Latch: Grasps breast easily, tongue down, lips flanged, rhythmical sucking.  Audible Swallowing: A few with stimulation  Type of Nipple: Everted at rest and after stimulation  Comfort (Breast/Nipple): Soft / non-tender  Hold (Positioning): No assistance needed to correctly position infant at breast.  LATCH Score: 9   Lactation Tools Discussed/Used    Interventions Interventions: Skin to skin;Support pillows  Discharge    Consult Status      Maryruth Hancock [facility] 03/10/2021, 8:47 AM

## 2021-03-11 ENCOUNTER — Ambulatory Visit: Payer: Self-pay

## 2021-03-11 LAB — GLUCOSE, CAPILLARY
Glucose-Capillary: 132 mg/dL — ABNORMAL HIGH (ref 70–99)
Glucose-Capillary: 146 mg/dL — ABNORMAL HIGH (ref 70–99)
Glucose-Capillary: 128 mg/dL — ABNORMAL HIGH (ref 70–99)
Glucose-Capillary: 125 mg/dL — ABNORMAL HIGH (ref 70–99)
Glucose-Capillary: 123 mg/dL — ABNORMAL HIGH (ref 70–99)
Glucose-Capillary: 73 mg/dL (ref 70–99)

## 2021-03-11 NOTE — Discharge Summary (Signed)
Postpartum Discharge Summary  Date of Service updated 03/11/2021     Patient Name: Alejandra Patel DOB: 03/17/1981 MRN: [id number]  Date of admission: 03/06/2021 Delivery date:03/09/2021  Delivering provider: Noralyn Pick  Date of discharge: 03/11/2021  Admitting diagnosis: Preeclampsia, third trimester [O14.93] Intrauterine pregnancy: [redacted]w[redacted]d    Secondary diagnosis:  Active Problems:   Preeclampsia, third trimester  Additional problems: gestational diabetes diet controlled    Discharge diagnosis: Preterm Pregnancy Delivered, CHTN with superimposed preeclampsia and GDM A1                                              Post partum procedures:None Augmentation: Pitocin and Cytotec Complications: None  Hospital course: Induction of Labor With Vaginal Delivery   40y.o. yo [id number] admitted to the hospital 03/06/2021 for induction of labor.  Indication for induction: Preeclampsia.  Patient had an uncomplicated labor course as follows: Membrane Rupture Time/Date: 6:16 PM ,03/08/2021   Delivery Method:Vaginal, Spontaneous  Episiotomy: None  Lacerations:  None  Details of delivery can be found in separate delivery note.  Patient had a routine postpartum course. Patient is discharged home 03/11/21.  Newborn Data: Birth date:03/09/2021  Birth time:2:29 AM  Gender:Female  Living status:Living  Apgars:8 ,3  Weight:2580 g   Magnesium Sulfate received: Yes: Seizure prophylaxis BMZ received: Yes Rhophylac:N/A MMR:N/A T-DaP:Given prenatally Flu: N/A Transfusion:No  Physical exam  Vitals:   03/10/21 2237 03/10/21 2241 03/11/21 0802 03/11/21 0803  BP:  (!) 150/69 (!) 147/88   Pulse:  64 69   Resp:  18 18   Temp:  98.6 F (37 C) 98.5 F (36.9 C)   TempSrc: Oral Oral Oral   SpO2:  100% 100%   Weight:    123.4 kg  Height:       General: alert, cooperative and no distress Lochia: appropriate Uterine Fundus: firm Incision: N/A DVT Evaluation: No evidence of DVT seen  on physical exam. Labs: Lab Results  Component Value Date   WBC 11.7 (H) 03/10/2021   HGB 10.0 (L) 03/10/2021   HCT 32.1 (L) 03/10/2021   MCV 81.1 03/10/2021   PLT 228 03/10/2021   CMP Latest Ref Rng & Units 03/10/2021  Glucose 70 - 99 mg/dL 84  BUN 6 - 20 mg/dL 6  Creatinine 0.44 - 1.00 mg/dL 0.92  Sodium 135 - 145 mmol/L 137  Potassium 3.5 - 5.1 mmol/L 3.8  Chloride 98 - 111 mmol/L 105  CO2 22 - 32 mmol/L 27  Calcium 8.9 - 10.3 mg/dL 7.4(L)  Total Protein 6.5 - 8.1 g/dL 5.2(L)  Total Bilirubin 0.3 - 1.2 mg/dL 0.2(L)  Alkaline Phos 38 - 126 U/L 120  AST 15 - 41 U/L 117(H)  ALT 0 - 44 U/L 283(H)   EdFlavia Shippercore: Edinburgh Postnatal Depression Scale Screening Tool 03/10/2021  I have been able to laugh and see the funny side of things. 0  I have looked forward with enjoyment to things. 0  I have blamed myself unnecessarily when things went wrong. 0  I have been anxious or worried for no good reason. 0  I have felt scared or panicky for no good reason. 0  Things have been getting on top of me. 0  I have been so unhappy that I have had difficulty sleeping. 0  I have felt sad or miserable. 0  I have been so unhappy that I have been crying. 0  The thought of harming myself has occurred to me. 0  Edinburgh Postnatal Depression Scale Total 0      After visit meds:  Allergies as of 03/11/2021      Reactions   Benadryl [diphenhydramine Hcl] Swelling      Medication List    STOP taking these medications   aspirin EC 81 MG tablet     TAKE these medications   bisacodyl 5 MG EC tablet Commonly known as: DULCOLAX Take 5 mg by mouth daily as needed for mild constipation or moderate constipation.   Blood Pressure Kit Devi 1 kit by Does not apply route once a week.   ferrous sulfate 325 (65 FE) MG tablet Take 1 tablet (325 mg total) by mouth daily.   ibuprofen 800 MG tablet Commonly known as: ADVIL Take 1 tablet (800 mg total) by mouth every 8 (eight) hours as needed.    NIFEdipine 30 MG 24 hr tablet Commonly known as: ADALAT CC Take 1 tablet (30 mg total) by mouth daily.   PrePLUS 27-1 MG Tabs Take 1 tablet by mouth daily.            Discharge Care Instructions  (From admission, onward)         Start     Ordered   03/11/21 0000  No dressing needed        03/11/21 1049           Discharge home in stable condition Infant Feeding: Breast Infant Disposition:NICU Discharge instruction: per After Visit Summary and Postpartum booklet. Activity: Advance as tolerated. Pelvic rest for 6 weeks.  Diet: carb modified diet and low salt diet Anticipated Birth Control: Unsure Postpartum Appointment:1 week Additional Postpartum F/U: BP check 1 week Future Appointments:No future appointments. Follow up Visit:  Follow-up Information    Christophe Louis, MD Follow up in 1 day(s).   Specialty: Obstetrics and Gynecology Why: A 1 week blood pressure check and 6 week postpartum visit will be arranged for you. Contact information: [address] [place] [address] [phone]                   03/11/2021 Christophe Louis, MD

## 2021-03-11 NOTE — Lactation Note (Signed)
This note was copied from a baby's chart. Lactation Consultation Note  Patient Name: [name] [id number] Date: 03/11/2021 Reason for consult: Follow-up assessment;Infant < 6lbs;Late-preterm 34-36.6wks;NICU baby;Maternal endocrine disorder Age:39 hours   LC in to visit with P4 Mom of LPTI in the NICU.    Mom very sleepy as she has been going to NICU to breastfeed her baby.  Mom states baby has been latching and breastfeeding well for 15-20 mins.  Mom hasn't been pumping, but states her breasts are getting heavier.  Encouraged Mom to pump and offered to assist.  Provided an elastic band to create a hand's free pumping band.  RN to cut the holes as Mom too tired for [facility] to assist at this point.  Reviewed importance of pumping consistently.  Engorgement prevention and treatment reviewed.  Mom waiting on insurance DEBP, referred Mom to pump rental program in gift shop.  Mom also reminded to remove pump parts and transport parts to NICU upon her discharge today.   Encouraged STS and offering breast with cues.  Baby being gavage fed at each feeding.  Mom aware of lactation support in NICU.   Lactation Tools Discussed/Used Tools: Pump Breast pump type: Manual;Double-Electric Breast Pump Pumping frequency: Mom not pumping due to baby latching, education on need to pump due to baby being preterm Pumped volume: 0 mL  Interventions Interventions: Breast feeding basics reviewed;Education;Skin to skin;Breast massage;Hand express;DEBP;Hand pump  Discharge Discharge Education: Engorgement and breast care  Consult Status Consult Status: Follow-up Date: 03/12/21 Follow-up type: In-patient    [name] 03/11/2021, 8:21 AM

## 2021-03-11 NOTE — Progress Notes (Signed)
Patient screened out for psychosocial assessment since none of the following apply: °Psychosocial stressors documented in mother or baby's chart °Gestation less than 32 weeks °Code at delivery  °Infant with anomalies °Please contact the Clinical Social Worker if specific needs arise, by MOB's request, or if MOB scores greater than 9/yes to question 10 on Edinburgh Postpartum Depression Screen. ° °Fawn Desrocher Boyd-Gilyard, MSW, LCSW °Clinical Social Work °[phone] °  °

## 2021-03-11 NOTE — Lactation Note (Signed)
This note was copied from a baby's chart. Lactation Consultation Note  Patient Name: Alejandra Patel [id number] Date: 03/11/2021   Age:39 hours   LC assisted in NICU.  Mom in recliner with baby in cross cradle hold with NNP assisting.  Mom using a U hold to support her breast, but breast elevated higher than what's comfortable.  Assisted Mom to support her breast further back away from areola, and drop her breast to a more comfortable height.  Recommended she place baby to breast, not move her breast to baby.  Took baby off breast and nipple was pinched  Swallows identified during deep jaw extensions.  No sign of stress noted during feeding.   Mom encouraged to pump both breasts AFTER baby breastfeeds to support a full milk supply. Mom has her pump parts and washing and drying bin in NICU room. . Mom is an experienced breastfeeder, but not with a preterm infant.  Mom aware of lactation support available to her.   Plan- 1- offer breast with cues making sure baby is latched deeply to breast 2- Pump both breasts after breastfeeding to support her milk supply and leave EBM for baby to gavage/bottle feed. 3- ask for LC prn    LATCH Score Latch: Grasps breast easily, tongue down, lips flanged, rhythmical sucking.  Audible Swallowing: Spontaneous and intermittent  Type of Nipple: Everted at rest and after stimulation  Comfort (Breast/Nipple): Soft / non-tender  Hold (Positioning): Assistance needed to correctly position infant at breast and maintain latch.  LATCH Score: 9    Interventions Interventions: Assisted with latch;Skin to skin;Breast massage;Hand express;DEBP;Education  Alejandra Patel 03/11/2021, 12:25 PM

## 2021-03-12 ENCOUNTER — Ambulatory Visit: Payer: Self-pay

## 2021-03-12 LAB — SURGICAL PATHOLOGY

## 2021-03-12 NOTE — Lactation Note (Addendum)
This note was copied from a baby's chart. Lactation Consultation Note  Patient Name: Alejandra Patel LTRVU'Y Date: 03/12/2021 Reason for consult: NICU baby;Follow-up assessment Age:39 days  Mom has + breast changes today. Breasts are heavy and full without engorgement. She is aware to pump frequently and will ask for [facility] f/u tomorrow if she has s/s of engorgement. We reviewed IDF and need to empty breasts prior to bf. Will plan f/u visit.   1619: LC paged to room to assist with pumping. Mom with 3+ oz. Breasts are soft. Feeding Mother's Current Feeding Choice: Breast Milk and Donor Milk Nipple Type: Nfant Extra Slow Flow (gold)   Interventions Interventions: Education  Discharge Discharge Education: Engorgement and breast care  Consult Status Consult Status: Follow-up Follow-up type: In-patient   Elder Negus, MA IBCLC 03/12/2021, 3:10 PM

## 2021-03-13 ENCOUNTER — Ambulatory Visit: Payer: Medicaid Other

## 2021-03-14 ENCOUNTER — Ambulatory Visit: Payer: Self-pay

## 2021-03-14 NOTE — Lactation Note (Addendum)
This note was copied from a baby's chart. Lactation Consultation Note  Patient Name: Alejandra Patel HTDSK'A Date: 03/14/2021 Reason for consult: Follow-up assessment;Late-preterm 34-36.6wks;NICU baby;Infant < 6lbs Age:39 days  LC in to assist/assess with breastfeeding.  Mom pumped 1.5 hr prior to feeding.  AC/PC weight done- baby gained 22 gms total of 16 mins (feeding 5 mins, 8 mins and 3 mins with burp/rest between)  Baby cueing actively prior to feeding. Mom breastfed baby in cross cradle and football holds on right breast.  Mom aware of what a deep latch is and feels like.  Assisted Mom to pull back her hand's from areola, and support baby's head well to keep baby's neck straight with chin into breast and nose right close to breast.  Mom in semi-leaned back position.    Baby noted to actively and consistently suck/swallow with ratios of 1:3 and 1:5.  Baby tolerated feeding without any signs of stress.  Baby coughed once at end of feeding and Mom immediately identified baby was done and removed her from breast and placed her on her chest.    Mom to have baby STS on her chest and she plans to pump about an hour prior to next feeding.   Mom will continue to offer breast with feeding cues.  RN will use algorithm and we will watch baby's weight gain.   LATCH Score Latch: Grasps breast easily, tongue down, lips flanged, rhythmical sucking.  Audible Swallowing: Spontaneous and intermittent  Type of Nipple: Everted at rest and after stimulation  Comfort (Breast/Nipple): Soft / non-tender  Hold (Positioning): Assistance needed to correctly position infant at breast and maintain latch.  LATCH Score: 9   Lactation Tools Discussed/Used Tools: Pump Breast pump type: Double-Electric Breast Pump  Interventions Interventions: Breast feeding basics reviewed;Assisted with latch;Skin to skin;Breast massage;Breast compression;Adjust position;Support pillows;Position  options;DEBP;Education  Consult Status Consult Status: Follow-up Date: 03/18/21 Follow-up type: In-patient    [name] 03/14/2021, 12:39 PM

## 2021-03-15 ENCOUNTER — Ambulatory Visit: Payer: Self-pay

## 2021-03-15 NOTE — Lactation Note (Signed)
This note was copied from a baby's chart. Lactation Consultation Note  Patient Name: Alejandra Patel AYOKH'T Date: 03/15/2021 Reason for consult: NICU baby;Follow-up assessment Age:39 days  Infant is ad lib today on full breast. Mom denies any adverse events while feeding. She post pumps prn for comfort. We reviewed feeding norms for 34-weekers. Mom is aware of LC services. Will plan f/u visit prn.    Consult Status Consult Status: Follow-up Follow-up type: In-patient   Elder Negus, MA IBCLC 03/15/2021, 3:04 PM

## 2021-03-16 ENCOUNTER — Ambulatory Visit: Payer: Self-pay

## 2021-03-16 NOTE — Lactation Note (Signed)
This note was copied from a baby's chart. Lactation Consultation Note  Patient Name: Alejandra Patel PJASN'K Date: 03/16/2021 Reason for consult: Follow-up assessment;NICU baby;Late-preterm 34-36.6wks Age:39 days   Mom breastfeeding infant in cross cradle.  Infant is latched deeply and has a wide gape with flanged lips.  Infant taking long pauses but swallows easily heard when infant actively sucks.  Good suck swallow pattern observed and mom is able to hear swallows as well.  She states she feels her breasts definitely soften after feedings.  LC reviewed waking techniques and highly encouraged mom to use compression and massage when infant is at the breasts and listen for infant swallows. Massage and compression used during the BF.    Mom has rented a multi user pump until her pump comes.   She is renting the [employer].  LC encouraged her to continue to supplement infant after breastfeeding.  Infant is 35w 1d.  Provider's DC note provides specific instructions for supplementing.  Mom is excited about going home.  LC encouraged mom to see Soyla Dryer, RN, IBCLC for out patient lactation support.    Maternal Data    Feeding Mother's Current Feeding Choice: Breast Milk  LATCH Score Latch: Repeated attempts needed to sustain latch, nipple held in mouth throughout feeding, stimulation needed to elicit sucking reflex. (needs stimulation to continuously suck and actively feed)  Audible Swallowing: A few with stimulation  Type of Nipple: Everted at rest and after stimulation  Comfort (Breast/Nipple): Soft / non-tender  Hold (Positioning): No assistance needed to correctly position infant at breast.  LATCH Score: 8   Lactation Tools Discussed/Used Tools: Pump Breast pump type: Double-Electric Breast Pump Reason for Pumping: LPTI/ NICU/ Supplement after feedings Pumping frequency: Encouraged to pump at least 8 times in 24 hours; every 2-3 hours, no more than a 4 hour stretch  at night  Interventions Interventions: Breast feeding basics reviewed;Education;Breast compression  Discharge Discharge Education: Warning signs for feeding baby;Outpatient recommendation;Outpatient Epic message sent Pump: Rented  Consult Status Consult Status: Complete Date: 03/16/21 Follow-up type: Other (comment) (encouraged mom to see Soyla Dryer, RN,  IBCLC at the [facility])    Alejandra Patel 03/16/2021, 1:21 PM

## 2021-04-22 DIAGNOSIS — M545 Low back pain, unspecified: Secondary | ICD-10-CM | POA: Diagnosis not present

## 2021-10-30 DIAGNOSIS — D500 Iron deficiency anemia secondary to blood loss (chronic): Secondary | ICD-10-CM | POA: Diagnosis not present

## 2021-10-30 DIAGNOSIS — Z0000 Encounter for general adult medical examination without abnormal findings: Secondary | ICD-10-CM | POA: Diagnosis not present

## 2021-10-30 DIAGNOSIS — E669 Obesity, unspecified: Secondary | ICD-10-CM | POA: Diagnosis not present

## 2021-10-30 DIAGNOSIS — Z13220 Encounter for screening for lipoid disorders: Secondary | ICD-10-CM | POA: Diagnosis not present

## 2021-10-30 DIAGNOSIS — R7303 Prediabetes: Secondary | ICD-10-CM | POA: Diagnosis not present

## 2021-10-30 DIAGNOSIS — I10 Essential (primary) hypertension: Secondary | ICD-10-CM | POA: Diagnosis not present

## 2022-01-10 DIAGNOSIS — I10 Essential (primary) hypertension: Secondary | ICD-10-CM | POA: Diagnosis not present

## 2022-01-10 DIAGNOSIS — R7303 Prediabetes: Secondary | ICD-10-CM | POA: Diagnosis not present

## 2022-11-11 ENCOUNTER — Other Ambulatory Visit: Payer: Self-pay | Admitting: Family Medicine

## 2022-11-11 DIAGNOSIS — Z1231 Encounter for screening mammogram for malignant neoplasm of breast: Secondary | ICD-10-CM

## 2022-12-23 ENCOUNTER — Ambulatory Visit (HOSPITAL_COMMUNITY)
Admission: EM | Admit: 2022-12-23 | Discharge: 2022-12-23 | Disposition: A | Discharge status: Home / Self Care | Payer: Medicaid Other | Attending: Nurse Practitioner | Admitting: Nurse Practitioner

## 2022-12-23 ENCOUNTER — Encounter (HOSPITAL_COMMUNITY): Payer: Self-pay

## 2022-12-23 VITALS — BP 125/78 | HR 60 | Temp 98.7°F | Resp 14

## 2022-12-23 DIAGNOSIS — L84 Corns and callosities: Secondary | ICD-10-CM

## 2022-12-23 DIAGNOSIS — I10 Essential (primary) hypertension: Secondary | ICD-10-CM

## 2022-12-23 NOTE — ED Provider Notes (Signed)
[place]    CSN: [id number] Arrival date & time: 12/23/22  1722      History   Chief Complaint Chief Complaint  Patient presents with   Foot Pain    HPI Alejandra Patel is a 42 y.o. female.   HPI She is complaining of right toe pain for several months. She stepped on glass from a broken cup/glass. She reports that she did a exploration of her own and she did not visualize any glass. She has also soaked her foot. She thought it was better but the pain has returned. She has also added and insole in her shoe. She reports increased traveling due to the death of her father in law. She went to work today and the pain was unbearable. The pain is directly in the center of where she dug in her foot.  Past Medical History:  Diagnosis Date   Gestational diabetes    with g2 and g3   Headache(784.0)    Pregnancy induced hypertension     Patient Active Problem List   Diagnosis Date Noted   Preeclampsia, third trimester 03/06/2021   Antepartum multigravida of advanced maternal age 22/11/2020   Obesity affecting pregnancy, antepartum 10/19/2020   Hypertension affecting pregnancy, antepartum 10/19/2020   Encounter for supervision of other normal pregnancy, first trimester 10/05/2020    Past Surgical History:  Procedure Laterality Date   CYST EXCISION  10/02/2020   Head   TOOTH EXTRACTION  2017    OB History     Gravida  6   Para  4   Term  3   Preterm  1   AB  2   Living  4      SAB  1   IAB      Ectopic  1   Multiple  0   Live Births  4            Home Medications    Prior to Admission medications   Medication Sig Start Date End Date Taking? Authorizing Provider  Blood Pressure Monitoring (BLOOD PRESSURE KIT) DEVI 1 kit by Does not apply route once a week. 10/05/20   Griffin Basil, MD  ibuprofen (ADVIL) 800 MG tablet Take 1 tablet (800 mg total) by mouth every 8 (eight) hours as needed. 03/10/21   Drema [place], DO  NIFEdipine (ADALAT  CC) 30 MG 24 hr tablet Take 1 tablet (30 mg total) by mouth daily. 03/10/21   Drema [place], DO    Family History Family History  Problem Relation Age of Onset   Ovarian cysts Sister     Social History Social History   Tobacco Use   Smoking status: Former    Types: Cigars    Quit date: 2017    Years since quitting: 7.0   Smokeless tobacco: Never  Vaping Use   Vaping Use: Never used  Substance Use Topics   Alcohol use: Not Currently    Comment: occasion   Drug use: No     Allergies   Benadryl [diphenhydramine hcl]   Review of Systems Review of Systems   Physical Exam Triage Vital Signs ED Triage Vitals  Enc Vitals Group     BP 12/23/22 1743 (!) 144/104     Pulse Rate 12/23/22 1743 62     Resp 12/23/22 1743 16     Temp 12/23/22 1743 98.4 F (36.9 C)     Temp Source 12/23/22 1743 Oral     SpO2 12/23/22 1743 100 %  Weight --      Height --      Head Circumference --      Peak Flow --      Pain Score 12/23/22 1749 7     Pain Loc --      Pain Edu? --      Excl. in [place]? --    No data found.  Updated Vital Signs BP 125/78 (BP Location: Left Arm)   Pulse 60   Temp 98.7 F (37.1 C) (Oral)   Resp 14   LMP 12/09/2022 (Approximate)   SpO2 100%   Visual Acuity Right Eye Distance:   Left Eye Distance:   Bilateral Distance:    Right Eye Near:   Left Eye Near:    Bilateral Near:     Physical Exam Constitutional:      Appearance: She is obese.  HENT:     Head: Normocephalic and atraumatic.  Musculoskeletal:        General: Normal range of motion.     Right foot: Tenderness present.       Feet:  Skin:    General: Skin is warm and dry.     Capillary Refill: Capillary refill takes less than 2 seconds.  Neurological:     Mental Status: She is alert.      UC Treatments / Results  Labs (all labs ordered are listed, but only abnormal results are displayed) Labs Reviewed - No data to display  EKG   Radiology No results  found.  Procedures Procedures (including critical care time)  Medications Ordered in UC Medications - No data to display  Initial Impression / Assessment and Plan / UC Course  I have reviewed the triage vital signs and the nursing notes.  Pertinent labs & imaging results that were available during my care of the patient were reviewed by me and considered in my medical decision making (see chart for details).     Right foot pain  Final Clinical Impressions(s) / UC Diagnoses   Final diagnoses:  Callus of foot  Hypertension, unspecified type     Discharge Instructions      You have been diagnoses with a callus to your right foot. You may have a small particular of glass but you have been referred to the podiatry for further evaluation. I recommend that you get a callus pad to give you more protection until you are able to get in with podiatry.  Encourage you to take your Nifedipine 30 mg as directed by you PCP. Continue with lifestyle changes. Please monitor BP and follow up as scheduled.      ED Prescriptions   None    PDMP not reviewed this encounter.   Dionisio David [place], [place] 12/23/22 [phone]

## 2022-12-23 NOTE — ED Triage Notes (Signed)
Patient reports that she stepped on a piece of glass approx 1-2 months ago. Patient states she has pain when she walks on the right foot below the pinky toe.  Patient states she has tried soaking her right foot in epsom Salts with no relief.

## 2022-12-23 NOTE — Discharge Instructions (Addendum)
You have been diagnoses with a callus to your right foot. You may have a small particular of glass but you have been referred to the podiatry for further evaluation. I recommend that you get a callus pad to give you more protection until you are able to get in with podiatry.  Encourage you to take your Nifedipine 30 mg as directed by you PCP. Continue with lifestyle changes. Please monitor BP and follow up as scheduled.

## 2023-01-12 ENCOUNTER — Encounter: Payer: Self-pay | Admitting: Podiatry

## 2023-01-12 ENCOUNTER — Ambulatory Visit (INDEPENDENT_AMBULATORY_CARE_PROVIDER_SITE_OTHER): Payer: Medicaid Other | Admitting: Podiatry

## 2023-01-12 DIAGNOSIS — S90851A Superficial foreign body, right foot, initial encounter: Secondary | ICD-10-CM

## 2023-01-12 NOTE — Progress Notes (Signed)
  Subjective:  Patient ID: Alejandra Patel, female    DOB: 15-Sep-1981,   MRN: [id number]  Chief Complaint  Patient presents with   Callouses    Possible callus ,Patient states she step on a piece a glass awhile ago and felt something in her foot and tried to get it out. Patient states she dose not want to loose her foot.    42 y.o. female presents for concern of a lesion on her right foot that developed after stepping on a piece of glass about a month ago. She relates she believes she had removed it and a week later started feeling it again and tried removing it again. Relates it has been painful to walk on.  . Denies any other pedal complaints. Denies n/v/f/c.   Past Medical History:  Diagnosis Date   Gestational diabetes    with g2 and g3   Headache(784.0)    Pregnancy induced hypertension     Objective:  Physical Exam: Vascular: DP/PT pulses 2/4 bilateral. CFT <3 seconds. Normal hair growth on digits. No edema.  Skin. No lacerations or abrasions bilateral feet. Right plantar foot with hyperkeratotic cored lesion with possible minuscule piece of glass removed.  Musculoskeletal: MMT 5/5 bilateral lower extremities in DF, PF, Inversion and Eversion. Deceased ROM in DF of ankle joint.  Neurological: Sensation intact to light touch.   Assessment:   1. Foreign body of skin of plantar aspect of right foot      Plan:  Patient was evaluated and treated and all questions answered. -Discussed foreign bodies vs porokeratosis with patient and treatment options.  -Without anesthesia hyperkeratotic tissue was debrided with chisel without incident to patient comfort. Possible very small piece of class removed as well as significant amount of hyperkeratotic tissue.  -Applied salycylic acid treatment to area with dressing. Advised to remove bandaging tomorrow.  -Encouraged daily moisturizing -Discussed use of pumice stone -Advised good supportive shoes and inserts -Patient to return to office  as needed or sooner if condition worsens.   Lorenda Peck, DPM

## 2023-01-13 ENCOUNTER — Ambulatory Visit
Admission: RE | Admit: 2023-01-13 | Discharge: 2023-01-13 | Disposition: A | Discharge status: Home / Self Care | Payer: Medicaid Other | Source: Ambulatory Visit | Attending: Family Medicine | Admitting: Family Medicine

## 2023-01-13 DIAGNOSIS — Z1231 Encounter for screening mammogram for malignant neoplasm of breast: Secondary | ICD-10-CM

## 2023-12-25 ENCOUNTER — Ambulatory Visit (HOSPITAL_COMMUNITY)
Admission: EM | Admit: 2023-12-25 | Discharge: 2023-12-25 | Disposition: A | Discharge status: Home / Self Care | Payer: Medicaid Other | Attending: Internal Medicine | Admitting: Internal Medicine

## 2023-12-25 ENCOUNTER — Encounter (HOSPITAL_COMMUNITY): Payer: Self-pay

## 2023-12-25 DIAGNOSIS — J069 Acute upper respiratory infection, unspecified: Secondary | ICD-10-CM

## 2023-12-25 MED ORDER — ALBUTEROL SULFATE HFA 108 (90 BASE) MCG/ACT IN AERS: 1.0000 | INHALATION_SPRAY | Freq: Four times a day (QID) | RESPIRATORY_TRACT | 0 refills | Status: AC | PRN

## 2023-12-25 MED ORDER — PREDNISONE 20 MG PO TABS: 40.0000 mg | ORAL_TABLET | Freq: Every day | ORAL | 0 refills | Status: AC

## 2023-12-25 MED ORDER — ALBUTEROL SULFATE HFA 108 (90 BASE) MCG/ACT IN AERS
INHALATION_SPRAY | RESPIRATORY_TRACT | Status: AC
  Filled 2023-12-25: qty 6.7

## 2023-12-25 MED ORDER — ALBUTEROL SULFATE HFA 108 (90 BASE) MCG/ACT IN AERS
2.0000 | INHALATION_SPRAY | Freq: Once | RESPIRATORY_TRACT | Status: AC
  Administered 2023-12-25: 2 via RESPIRATORY_TRACT

## 2023-12-25 MED ORDER — METHYLPREDNISOLONE ACETATE 40 MG/ML IJ SUSP
40.0000 mg | Freq: Once | INTRAMUSCULAR | Status: AC
  Administered 2023-12-25: 40 mg via INTRAMUSCULAR

## 2023-12-25 MED ORDER — PROMETHAZINE-DM 6.25-15 MG/5ML PO SYRP: 5.0000 mL | ORAL_SOLUTION | Freq: Three times a day (TID) | ORAL | 0 refills | Status: AC | PRN

## 2023-12-25 MED ORDER — AZITHROMYCIN 250 MG PO TABS: 250.0000 mg | ORAL_TABLET | Freq: Every day | ORAL | 0 refills | Status: DC

## 2023-12-25 MED ORDER — METHYLPREDNISOLONE ACETATE 40 MG/ML IJ SUSP
INTRAMUSCULAR | Status: AC
  Filled 2023-12-25: qty 1

## 2023-12-25 NOTE — ED Triage Notes (Signed)
Patient reports that she has had a productive cough with yellow/green sputum x 4 days and SOB x 2 days.  Patient states she has been using Mucinex and an OTC inhaler.

## 2023-12-25 NOTE — Discharge Instructions (Addendum)
Upper respiratory infection with cough.  This has been going on for 4 to 5 days now with significant symptoms.  We will treat with the following: Medrol injection given today. This is a steroid to help with airway inflammation.  Azithromycin 250mg  Take 2 tablets today and then 1 tablet daily for 4 more days. This is an antibiotic Prednisone 40 mg daily for 5 days. Take this in the morning. This is a steroid to help with inflammation and swelling Promethazine DM 5 mL every 8 hours as needed for cough.  Use caution as this medication can cause drowsiness.  Albuterol inhaler 1-2 puffs every 6 hours as needed for wheezing/shortness of breath. We gave the first dose here at 8:30 pm. Rest and stay hydrated.  Return to urgent care or PCP if symptoms worsen or fail to resolve.

## 2023-12-25 NOTE — ED Provider Notes (Signed)
[facility]    CSN: [id number] Arrival date & time: 12/25/23  1928      History   Chief Complaint Chief Complaint  Patient presents with   Cough   Shortness of Breath    HPI Alejandra Patel is a 43 y.o. female.   43 year old female who presents to urgent care with complaints of productive cough, shortness of breath, and green sputum.  This has been going on for about 4 or 5 days.  She reports that a family member had similar symptoms and she thinks that she may have gotten from them.  She was using ibuprofen early in the week due to muscle soreness but she is no longer having that symptom.  She has been using Mucinex to help with the congestion.  She has had similar symptoms in the past and at that time used steroids and an inhaler which really helped.  She has had some fevers and chills as well.     Cough Associated symptoms: fever and shortness of breath   Associated symptoms: no chest pain, no chills, no ear pain, no rash and no sore throat   Shortness of Breath Associated symptoms: cough and fever   Associated symptoms: no abdominal pain, no chest pain, no ear pain, no rash, no sore throat and no vomiting     Past Medical History:  Diagnosis Date   Gestational diabetes    with g2 and g3   Headache(784.0)    Pregnancy induced hypertension     Patient Active Problem List   Diagnosis Date Noted   Preeclampsia, third trimester 03/06/2021   Antepartum multigravida of advanced maternal age 50/11/2020   Obesity affecting pregnancy, antepartum 10/19/2020   Hypertension affecting pregnancy, antepartum 10/19/2020   Encounter for supervision of other normal pregnancy, first trimester 10/05/2020    Past Surgical History:  Procedure Laterality Date   CYST EXCISION  10/02/2020   Head   TOOTH EXTRACTION  2017    OB History     Gravida  6   Para  4   Term  3   Preterm  1   AB  2   Living  4      SAB  1   IAB      Ectopic  1   Multiple  0    Live Births  4            Home Medications    Prior to Admission medications   Medication Sig Start Date End Date Taking? Authorizing Provider  Blood Pressure Monitoring (BLOOD PRESSURE KIT) DEVI 1 kit by Does not apply route once a week. 10/05/20   Warden Fillers, MD  ibuprofen (ADVIL) 800 MG tablet Take 1 tablet (800 mg total) by mouth every 8 (eight) hours as needed. 03/10/21   [name], DO  NIFEdipine (ADALAT CC) 30 MG 24 hr tablet Take 1 tablet (30 mg total) by mouth daily. 03/10/21   [name], DO    Family History Family History  Problem Relation Age of Onset   Ovarian cysts Sister     Social History Social History   Tobacco Use   Smoking status: Some Days    Types: Cigars    Last attempt to quit: 2017    Years since quitting: 8.0   Smokeless tobacco: Never  Vaping Use   Vaping status: Never Used  Substance Use Topics   Alcohol use: Yes    Comment: occasion   Drug use: No  Allergies   Benadryl [diphenhydramine hcl]   Review of Systems Review of Systems  Constitutional:  Positive for fever. Negative for chills.  HENT:  Negative for ear pain and sore throat.   Eyes:  Negative for pain and visual disturbance.  Respiratory:  Positive for cough and shortness of breath.   Cardiovascular:  Negative for chest pain and palpitations.  Gastrointestinal:  Negative for abdominal pain and vomiting.  Genitourinary:  Negative for dysuria and hematuria.  Musculoskeletal:  Negative for arthralgias and back pain.  Skin:  Negative for color change and rash.  Neurological:  Negative for seizures and syncope.  All other systems reviewed and are negative.    Physical Exam Triage Vital Signs ED Triage Vitals  Encounter Vitals Group     BP 12/25/23 1947 (!) 147/92     Systolic BP Percentile --      Diastolic BP Percentile --      Pulse Rate 12/25/23 1947 96     Resp 12/25/23 1947 20     Temp 12/25/23 1947 99 F (37.2 C)     Temp Source 12/25/23  1947 Oral     SpO2 12/25/23 1947 95 %     Weight --      Height --      Head Circumference --      Peak Flow --      Pain Score 12/25/23 1949 0     Pain Loc --      Pain Education --      Exclude from Growth Chart --    No data found.  Updated Vital Signs BP (!) 147/92 (BP Location: Left Arm)   Pulse 96   Temp 99 F (37.2 C) (Oral)   Resp 20   LMP 12/07/2023 (Approximate)   SpO2 95%   Visual Acuity Right Eye Distance:   Left Eye Distance:   Bilateral Distance:    Right Eye Near:   Left Eye Near:    Bilateral Near:     Physical Exam Vitals and nursing note reviewed.  Constitutional:      General: She is not in acute distress.    Appearance: She is well-developed.  HENT:     Head: Normocephalic and atraumatic.  Eyes:     Conjunctiva/sclera: Conjunctivae normal.  Cardiovascular:     Rate and Rhythm: Normal rate and regular rhythm.     Heart sounds: No murmur heard. Pulmonary:     Effort: Pulmonary effort is normal. No respiratory distress.     Breath sounds: Examination of the right-upper field reveals decreased breath sounds. Examination of the left-upper field reveals decreased breath sounds. Decreased breath sounds present. No wheezing, rhonchi or rales.  Abdominal:     Palpations: Abdomen is soft.     Tenderness: There is no abdominal tenderness.  Musculoskeletal:        General: No swelling.     Cervical back: Neck supple.  Skin:    General: Skin is warm and dry.     Capillary Refill: Capillary refill takes less than 2 seconds.  Neurological:     General: No focal deficit present.     Mental Status: She is alert.  Psychiatric:        Mood and Affect: Mood normal.      UC Treatments / Results  Labs (all labs ordered are listed, but only abnormal results are displayed) Labs Reviewed - No data to display  EKG   Radiology No results found.  Procedures Procedures (including  critical care time)  Medications Ordered in UC Medications - No data  to display  Initial Impression / Assessment and Plan / UC Course  I have reviewed the triage vital signs and the nursing notes.  Pertinent labs & imaging results that were available during my care of the patient were reviewed by me and considered in my medical decision making (see chart for details).     Acute upper respiratory infection   Upper respiratory infection with cough.  This has been going on for 4 to 5 days now with significant symptoms.  We will treat with the following: Medrol injection given today. This is a steroid to help with airway inflammation.  Azithromycin 250mg  Take 2 tablets today and then 1 tablet daily for 4 more days. This is an antibiotic Prednisone 40 mg daily for 5 days. Take this in the morning. This is a steroid to help with inflammation and swelling Promethazine DM 5 mL every 8 hours as needed for cough.  Use caution as this medication can cause drowsiness.  Albuterol inhaler 1-2 puffs every 6 hours as needed for wheezing/shortness of breath. We gave the first dose here at 8:30 pm. Rest and stay hydrated.  Return to urgent care or PCP if symptoms worsen or fail to resolve.    Final Clinical Impressions(s) / UC Diagnoses   Final diagnoses:  None   Discharge Instructions   None    ED Prescriptions   None    PDMP not reviewed this encounter.   Landis Martins, [place] 12/25/23 2037

## 2024-04-11 ENCOUNTER — Emergency Department (HOSPITAL_COMMUNITY)

## 2024-04-11 ENCOUNTER — Other Ambulatory Visit: Payer: Self-pay

## 2024-04-11 ENCOUNTER — Encounter (HOSPITAL_COMMUNITY): Payer: Self-pay | Admitting: Emergency Medicine

## 2024-04-11 ENCOUNTER — Emergency Department (HOSPITAL_COMMUNITY)
Admission: EM | Admit: 2024-04-11 | Discharge: 2024-04-11 | Disposition: A | Discharge status: Home / Self Care | Attending: Emergency Medicine | Admitting: Emergency Medicine

## 2024-04-11 DIAGNOSIS — R519 Headache, unspecified: Secondary | ICD-10-CM | POA: Insufficient documentation

## 2024-04-11 DIAGNOSIS — R42 Dizziness and giddiness: Secondary | ICD-10-CM | POA: Diagnosis present

## 2024-04-11 LAB — CBC
HCT: 37.2 % (ref 36.0–46.0)
Hemoglobin: 11.5 g/dL — ABNORMAL LOW (ref 12.0–15.0)
MCH: 24 pg — ABNORMAL LOW (ref 26.0–34.0)
MCHC: 30.9 g/dL (ref 30.0–36.0)
MCV: 77.5 fL — ABNORMAL LOW (ref 80.0–100.0)
Platelets: 241 10*3/uL (ref 150–400)
RBC: 4.8 MIL/uL (ref 3.87–5.11)
RDW: 15.4 % (ref 11.5–15.5)
WBC: 5.1 10*3/uL (ref 4.0–10.5)
nRBC: 0 % (ref 0.0–0.2)

## 2024-04-11 LAB — RESP PANEL BY RT-PCR (RSV, FLU A&B, COVID)  RVPGX2
Influenza A by PCR: NEGATIVE
Influenza B by PCR: NEGATIVE
Resp Syncytial Virus by PCR: NEGATIVE
SARS Coronavirus 2 by RT PCR: NEGATIVE

## 2024-04-11 LAB — URINALYSIS, ROUTINE W REFLEX MICROSCOPIC
Bilirubin Urine: NEGATIVE
Glucose, UA: NEGATIVE mg/dL
Hgb urine dipstick: NEGATIVE
Ketones, ur: 5 mg/dL — AB
Leukocytes,Ua: NEGATIVE
Nitrite: NEGATIVE
Protein, ur: 30 mg/dL — AB
Specific Gravity, Urine: 1.03 (ref 1.005–1.030)
pH: 6 (ref 5.0–8.0)

## 2024-04-11 LAB — HCG, SERUM, QUALITATIVE: Preg, Serum: NEGATIVE

## 2024-04-11 LAB — COMPREHENSIVE METABOLIC PANEL WITH GFR
ALT: 24 U/L (ref 0–44)
AST: 27 U/L (ref 15–41)
Albumin: 3.9 g/dL (ref 3.5–5.0)
Alkaline Phosphatase: 47 U/L (ref 38–126)
Anion gap: 8 (ref 5–15)
BUN: 15 mg/dL (ref 6–20)
CO2: 22 mmol/L (ref 22–32)
Calcium: 8.7 mg/dL — ABNORMAL LOW (ref 8.9–10.3)
Chloride: 107 mmol/L (ref 98–111)
Creatinine, Ser: 0.7 mg/dL (ref 0.44–1.00)
GFR, Estimated: >60 mL/min (ref >60)
Glucose, Bld: 114 mg/dL — ABNORMAL HIGH (ref 70–99)
Potassium: 3.9 mmol/L (ref 3.5–5.1)
Sodium: 137 mmol/L (ref 135–145)
Total Bilirubin: 0.5 mg/dL (ref 0.0–1.2)
Total Protein: 7.2 g/dL (ref 6.5–8.1)

## 2024-04-11 LAB — LIPASE, BLOOD: Lipase: 53 U/L — ABNORMAL HIGH (ref 11–51)

## 2024-04-11 MED ORDER — METOCLOPRAMIDE HCL 5 MG/ML IJ SOLN
10.0000 mg | Freq: Once | INTRAMUSCULAR | Status: AC
  Administered 2024-04-11: 10 mg via INTRAVENOUS
  Filled 2024-04-11: qty 2

## 2024-04-11 MED ORDER — ONDANSETRON 4 MG PO TBDP
4.0000 mg | ORAL_TABLET | Freq: Once | ORAL | Status: AC | PRN
  Administered 2024-04-11: 4 mg via ORAL
  Filled 2024-04-11: qty 1

## 2024-04-11 MED ORDER — IOHEXOL 350 MG/ML SOLN
75.0000 mL | Freq: Once | INTRAVENOUS | Status: AC | PRN
  Administered 2024-04-11: 75 mL via INTRAVENOUS

## 2024-04-11 MED ORDER — MECLIZINE HCL 25 MG PO TABS
25.0000 mg | ORAL_TABLET | Freq: Once | ORAL | Status: AC
  Administered 2024-04-11: 25 mg via ORAL
  Filled 2024-04-11: qty 1

## 2024-04-11 MED ORDER — DEXAMETHASONE SODIUM PHOSPHATE 10 MG/ML IJ SOLN
6.0000 mg | Freq: Once | INTRAMUSCULAR | Status: AC
  Administered 2024-04-11: 6 mg via INTRAVENOUS
  Filled 2024-04-11: qty 1

## 2024-04-11 MED ORDER — SODIUM CHLORIDE 0.9 % IV BOLUS
500.0000 mL | Freq: Once | INTRAVENOUS | Status: AC
  Administered 2024-04-11: 500 mL via INTRAVENOUS

## 2024-04-11 MED ORDER — ONDANSETRON 4 MG PO TBDP
4.0000 mg | ORAL_TABLET | Freq: Three times a day (TID) | ORAL | 0 refills | Status: DC | PRN
Start: 2024-04-11 — End: 2024-08-24

## 2024-04-11 MED ORDER — MECLIZINE HCL 25 MG PO TABS: 25.0000 mg | ORAL_TABLET | Freq: Three times a day (TID) | ORAL | 0 refills | Status: DC | PRN

## 2024-04-11 MED ORDER — KETOROLAC TROMETHAMINE 30 MG/ML IJ SOLN
30.0000 mg | Freq: Once | INTRAMUSCULAR | Status: AC
Start: 2024-04-11 — End: 2024-04-11
  Administered 2024-04-11: 30 mg via INTRAVENOUS
  Filled 2024-04-11: qty 1

## 2024-04-11 NOTE — ED Notes (Signed)
 Pt ambulated to bathroom with moderate assistance. Pt states she is still feeling dizzy while ambulating

## 2024-04-11 NOTE — ED Notes (Signed)
 Patient discharged by RN. Patient verbalizes understanding of instructions with no additional questions. In wheelchair to lobby.

## 2024-04-11 NOTE — ED Provider Notes (Signed)
 [place] EM[facility]GENC[facility] DEPARTMENT AT [facility] Provider Note   CSN: [id number] Arrival date & time: 04/11/24  1017     History  Chief Complaint  Patient presents with   Abdominal Pain   Emesis    INFINITY Alejandra Patel is a 43 y.o. female presented to ED with a headache, nausea, vertigo.  Patient reports she began having a headache yesterday evening, predominantly the top of her head, but then towards her neck as well.  She denies any injuries to her head or neck, massages, whiplash injury.  She says she began having vertigo today as well which she has had in the past with "riding amusement rides".  However she felt nauseated and vomited this morning as well.  She denies fevers, chills, neck stiffness, diarrhea, sick contacts in the house.  Denies issues with recurring or significant headaches, though she does get them occasionally.  She does have some sensitivity to light with her headache  She says she had a very similar presentation to this "a few weeks ago" where she began to feel fatigued and had a headache and vertigo at work, but then took a nap and the headache resolved.  HPI     Home Medications Prior to Admission medications   Medication Sig Start Date End Date Taking? Authorizing Provider  albuterol  (VENTOLIN  HFA) 108 (90 Base) MCG/ACT inhaler Inhale 1-2 puffs into the lungs every 6 (six) hours as needed for wheezing or shortness of breath. 12/25/23   White, Elizabeth A, PA-C  azithromycin  (ZITHROMAX ) 250 MG tablet Take 1 tablet (250 mg total) by mouth daily. Take first 2 tablets together, then 1 every day until finished. 12/25/23   White, Marjory Signs, PA-C  Blood Pressure Monitoring (BLOOD PRESSURE KIT) DEVI 1 kit by Does not apply route once a week. 10/05/20   Abigail Abler, MD  ibuprofen  (ADVIL ) 800 MG tablet Take 1 tablet (800 mg total) by mouth every 8 (eight) hours as needed. 03/10/21   Meldon Sport, DO  NIFEdipine  (ADALAT  CC) 30 MG 24 hr tablet Take 1 tablet (30  mg total) by mouth daily. 03/10/21   Davies, Melissa, DO  promethazine -dextromethorphan (PROMETHAZINE -DM) 6.25-15 MG/5ML syrup Take 5 mLs by mouth every 8 (eight) hours as needed for cough. 12/25/23   Kreg Pesa, PA-C      Allergies    Benadryl [diphenhydramine hcl]    Review of Systems   Review of Systems  Physical Exam Updated Vital Signs BP (!) 138/90 (BP Location: Right Arm)   Pulse 88   Temp 98.2 F (36.8 C) (Oral)   Resp 19   SpO2 100%  Physical Exam Constitutional:      General: She is not in acute distress. HENT:     Head: Normocephalic and atraumatic.  Eyes:     Conjunctiva/sclera: Conjunctivae normal.     Pupils: Pupils are equal, round, and reactive to light.  Cardiovascular:     Rate and Rhythm: Normal rate and regular rhythm.  Pulmonary:     Effort: Pulmonary effort is normal. No respiratory distress.  Abdominal:     General: There is no distension.     Tenderness: There is no abdominal tenderness.  Skin:    General: Skin is warm and dry.  Neurological:     Mental Status: She is alert. Mental status is at baseline.     Comments: No nuchal rigidity on exam, no spinal midline tenderness, no unilateral nystagmus or gaze palsy, cranial nerves grossly intact, sensation and  strength testing grossly intact  Psychiatric:        Mood and Affect: Mood normal.        Behavior: Behavior normal.     ED Results / Procedures / Treatments   Labs (all labs ordered are listed, but only abnormal results are displayed) Labs Reviewed  LIPASE, BLOOD - Abnormal; Notable for the following components:      Result Value   Lipase 53 (*)    All other components within normal limits  COMPREHENSIVE METABOLIC PANEL WITH GFR - Abnormal; Notable for the following components:   Glucose, Bld 114 (*)    Calcium  8.7 (*)    All other components within normal limits  CBC - Abnormal; Notable for the following components:   Hemoglobin 11.5 (*)    MCV 77.5 (*)    MCH 24.0 (*)     All other components within normal limits  URINALYSIS, ROUTINE W REFLEX MICROSCOPIC - Abnormal; Notable for the following components:   APPearance HAZY (*)    Ketones, ur 5 (*)    Protein, ur 30 (*)    Bacteria, UA RARE (*)    All other components within normal limits  RESP PANEL BY RT-PCR (RSV, FLU A&B, COVID)  RVPGX2  HCG, SERUM, QUALITATIVE    EKG None  Radiology No results found.  Procedures Procedures    Medications Ordered in ED Medications  ondansetron  (ZOFRAN -ODT) disintegrating tablet 4 mg (4 mg Oral Given 04/11/24 1051)  metoCLOPramide (REGLAN) injection 10 mg (10 mg Intravenous Given 04/11/24 1329)  dexamethasone (DECADRON) injection 6 mg (6 mg Intravenous Given 04/11/24 1330)  ketorolac  (TORADOL ) 30 MG/ML injection 30 mg (30 mg Intravenous Given 04/11/24 1329)  sodium chloride  0.9 % bolus 500 mL (0 mLs Intravenous Stopped 04/11/24 1506)  meclizine (ANTIVERT) tablet 25 mg (25 mg Oral Given 04/11/24 1323)  iohexol (OMNIPAQUE) 350 MG/ML injection 75 mL (75 mLs Intravenous Contrast Given 04/11/24 1513)    ED Course/ Medical Decision Making/ A&P Clinical Course as of 04/11/24 1532  Mon Apr 11, 2024  1529 Headache is significantly improved on reassessment, but she continues having some vertigo (although also improved).  We'll PO challenge, ambulate and follow up CT angio imaging [MT]    Clinical Course User Index [MT] Dawnita Molner, Janalyn Me, MD                                 Medical Decision Making Amount and/or Complexity of Data Reviewed Labs: ordered. Radiology: ordered.  Risk Prescription drug management.   This patient presents to the Emergency Department with complaint of headache.  This involves an extensive number of treatment options, and is a complaint that carries with it a high risk of complications and morbidity.  The differential diagnosis for headache includes tension type headache vs occipital headache vs migraine vs sinusitis vs intracranial bleed vs other  I  ordered, reviewed, and interpreted labs, including BMP and CBC.  There were no immediate, life-threatening emergencies found in this labwork.   I ordered medication for headache and/or nausea and vertigo I ordered imaging studies which included CTA head and neck, which was pending at the time of signout  Patient reassessed - see ED course  Signed out at 3:30 pm to Dr Manus Sellers pending follow up on CT imaging.  RN to attempt PO challenge and ambulate as well.        Final Clinical Impression(s) / ED Diagnoses Final diagnoses:  Vertigo  Nonintractable headache, unspecified chronicity pattern, unspecified headache type    Rx / DC Orders ED Discharge Orders     None         Arvilla Birmingham, MD 04/11/24 [phone]

## 2024-04-11 NOTE — Discharge Instructions (Addendum)
 Your history, exam however today are consistent with vertigo.  The CT imaging of your head and neck were overall reassuring as we discussed.  As your symptoms have improved and vital signs are reassuring, feel you are safe for discharge home.  We spoke with the ENT team and your primary doctor.  Please rest and stay hydrated and you may use the meclizine and nausea still with symptoms as well.  Any symptoms change or worsen acutely, return to the emergency department.

## 2024-04-11 NOTE — ED Provider Notes (Signed)
 3:58 PM Care assumed from Dr. Gordon Latus.  At time of transfer of care, patient is awaiting for results of CT head and neck and reassessment.  Headache reportedly resolved but she will still feeling vertiginous.  If CTA is reassuring, plan of care early outpatient ENT follow-up and meclizine prescription however patient is still so vertiginous she cannot safely ambulate, will consider MRI.  6:11 PM CTA returned reassuring.  Patient reassessed and she reports her dizziness has improved.  She is able to admit to the bathroom with some assistance.  She says the medicine is helping.  Given her improvement, if she passed p.o. challenge will plan for discharge home with refill for meclizine and ENT and PCP follow-up.  Patient agrees, anticipate reassessment and discharge.  6:55 PM Patient felt much better on reassessment and would like to go home.  Will discharge her for outpatient follow-up with ENT and PCP.  Prescription printed for meclizine and Zofran .  Patient discharged in good condition.  Clinical Impression: 1. Vertigo   2. Nonintractable headache, unspecified chronicity pattern, unspecified headache type     Disposition: Discharge  Condition: Good  I have discussed the results, Dx and Tx plan with the pt(& family if present). He/she/they expressed understanding and agree(s) with the plan. Discharge instructions discussed at great length. Strict return precautions discussed and pt &/or family have verbalized understanding of the instructions. No further questions at time of discharge.    New Prescriptions   MECLIZINE (ANTIVERT) 25 MG TABLET    Take 1 tablet (25 mg total) by mouth 3 (three) times daily as needed for dizziness.   ONDANSETRON  (ZOFRAN -ODT) 4 MG DISINTEGRATING TABLET    Take 1 tablet (4 mg total) by mouth every 8 (eight) hours as needed for nausea or vomiting.    Follow Up: [facility] Emergency Department at [facility] [address] [place]   [phone] Go to  If symptoms worsen  Evelina Hippo, MD [address] [place] [address] [phone]   with ENT     Meko Masterson, Marine Sia, MD 04/11/24 1919

## 2024-04-11 NOTE — ED Notes (Signed)
Unable to provide urine

## 2024-04-11 NOTE — ED Notes (Signed)
 Pt provide with urine cup and made aware we need a sample

## 2024-04-11 NOTE — ED Notes (Signed)
 Patient provided snack and drink per MD

## 2024-04-11 NOTE — ED Triage Notes (Signed)
 Pt here from home with c/o abd pain and n/v along with dizziness  that started earlier this morning , pt does admit to smoking some weed over the weekend

## 2024-04-15 ENCOUNTER — Encounter (INDEPENDENT_AMBULATORY_CARE_PROVIDER_SITE_OTHER): Payer: Self-pay | Admitting: Otolaryngology

## 2024-04-15 ENCOUNTER — Ambulatory Visit (INDEPENDENT_AMBULATORY_CARE_PROVIDER_SITE_OTHER): Admitting: Otolaryngology

## 2024-04-15 ENCOUNTER — Other Ambulatory Visit: Payer: Self-pay | Admitting: Family Medicine

## 2024-04-15 VITALS — BP 142/101 | HR 66 | Ht 67.0 in | Wt 224.0 lb

## 2024-04-15 DIAGNOSIS — Z1231 Encounter for screening mammogram for malignant neoplasm of breast: Secondary | ICD-10-CM

## 2024-04-15 DIAGNOSIS — R42 Dizziness and giddiness: Secondary | ICD-10-CM | POA: Diagnosis not present

## 2024-04-15 DIAGNOSIS — R519 Headache, unspecified: Secondary | ICD-10-CM

## 2024-04-15 DIAGNOSIS — R2681 Unsteadiness on feet: Secondary | ICD-10-CM

## 2024-04-15 NOTE — Progress Notes (Signed)
 ENT CONSULT:  Reason for Consult: dizziness and giddiness  HPI: Discussed the use of AI scribe software for clinical note transcription with the patient, who gave verbal consent to proceed.  History of Present Illness Alejandra Patel is a 43 year old female who presents with episodes of dizziness/N/V and unsteadiness that improve with sleep and take hours to day to get better. She was referred by the emergency room for further evaluation of her symptoms.  She has been experiencing dizziness since Sunday night, which began during activities such as gardening and running errands. The dizziness is described as feeling 'drunk' and is accompanied by nausea and vomiting. Initially attributing it to exhaustion, she went to bed, but upon waking, she was unable to regain her composure and sought care at the ER on Monday morning.  This is not the first occurrence of these symptoms, as she recalls a similar episode about a month ago. During these episodes, she experiences a heavy feeling and fatigue, necessitating rest. No room-spinning sensation is reported, but she describes a feeling of fog and unsteadiness. Closing her eyes and sleeping provides temporary relief, but symptoms persist upon waking.  She was prescribed meclizine  but has not taken it as the dizziness subsided by the time she obtained the medication. No history of headaches or migraines outside of these episodes. Notably, this is the first time the symptoms have lasted for several days, whereas in the past, rest for a few hours would alleviate them.  She has no history of chronic neck or back pain, but she does have chronic hip pain from a fall six years ago. She reports a past issue with her right eye, which felt gritty and had a raised area on the sclera, but this has since resolved without affecting her vision.  Currently, her symptoms are less severe than earlier in the week, but she attributes some of her current exhaustion to lack of  sleep after taking her daughter to the ER for a flu-borne illness.   Records Reviewed:  ED visit 04/11/24 Care assumed from Dr. Gordon Latus.  At time of transfer of care, patient is awaiting for results of CT head and neck and reassessment.  Headache reportedly resolved but she will still feeling vertiginous.  If CTA is reassuring, plan of care early outpatient ENT follow-up and meclizine  prescription however patient is still so vertiginous she cannot safely ambulate, will consider MRI.   6:11 PM CTA returned reassuring.  Patient reassessed and she reports her dizziness has improved.  She is able to admit to the bathroom with some assistance.  She says the medicine is helping.   Given her improvement, if she passed p.o. challenge will plan for discharge home with refill for meclizine  and ENT and PCP follow-up.  Patient agrees, anticipate reassessment and discharge.   6:55 PM Patient felt much better on reassessment and would like to go home.  Will discharge her for outpatient follow-up with ENT and PCP.  Prescription printed for meclizine  and Zofran .  Patient discharged in good condition.   Clinical Impression: 1. Vertigo   2. Nonintractable headache, unspecified chronicity pattern, unspecified headache type      Alejandra Patel is a 43 y.o. female presented to ED with a headache, nausea, vertigo.  Patient reports she began having a headache yesterday evening, predominantly the top of her head, but then towards her neck as well.  She denies any injuries to her head or neck, massages, whiplash injury.  She says she began having  vertigo today as well which she has had in the past with "riding amusement rides".  However she felt nauseated and vomited this morning as well.  She denies fevers, chills, neck stiffness, diarrhea, sick contacts in the house.  Denies issues with recurring or significant headaches, though she does get them occasionally.   This patient presents to the Emergency Department with  complaint of headache.  This involves an extensive number of treatment options, and is a complaint that carries with it a high risk of complications and morbidity.  The differential diagnosis for headache includes tension type headache vs occipital headache vs migraine vs sinusitis vs intracranial bleed vs other   I ordered, reviewed, and interpreted labs, including BMP and CBC.  There were no immediate, life-threatening emergencies found in this labwork.   I ordered medication for headache and/or nausea and vertigo I ordered imaging studies which included CTA head and neck, which was pending at the time of signout    Past Medical History:  Diagnosis Date   Gestational diabetes    with g2 and g3   Headache(784.0)    Pregnancy induced hypertension     Past Surgical History:  Procedure Laterality Date   CYST EXCISION  10/02/2020   Head   TOOTH EXTRACTION  2017    Family History  Problem Relation Age of Onset   Ovarian cysts Sister     Social History:  reports that she has been smoking cigars. She has never used smokeless tobacco. She reports current alcohol use. She reports that she does not use drugs.  Allergies:  Allergies  Allergen Reactions   Benadryl [Diphenhydramine Hcl] Swelling    Medications: I have reviewed the patient's current medications.  The PMH, PSH, Medications, Allergies, and SH were reviewed and updated.  ROS: Constitutional: Negative for fever, weight loss and weight gain. Cardiovascular: Negative for chest pain and dyspnea on exertion. Respiratory: Is not experiencing shortness of breath at rest. Gastrointestinal: Negative for nausea and vomiting. Neurological: Negative for headaches. Psychiatric: The patient is not nervous/anxious  Blood pressure (!) 142/101, pulse 66, height 5\' 7"  (1.702 m), weight 224 lb (101.6 kg), SpO2 93%, unknown if currently breastfeeding.  PHYSICAL EXAM:  Exam: General: Well-developed,  well-nourished Respiratory Respiratory effort: Equal inspiration and expiration without stridor Cardiovascular Peripheral Vascular: Warm extremities with equal color/perfusion Eyes: No nystagmus with equal extraocular motion bilaterally Neuro/Psych/Balance: Patient oriented to person, place, and time; Appropriate mood and affect; Gait is intact with no imbalance; Cranial nerves I-XII are intact. No nystagmus. Head and Face Inspection: Normocephalic and atraumatic without mass or lesion Palpation: Facial skeleton intact without bony stepoffs Salivary Glands: No mass or tenderness Facial Strength: Facial motility symmetric and full bilaterally ENT Pinna: External ear intact and fully developed External canal: Canal is patent with intact skin Tympanic Membrane: Clear and mobile External Nose: No scar or anatomic deformity Internal Nose: Septum intact and midline. No edema, polyp, or rhinorrhea Lips, Teeth, and gums: Mucosa and teeth intact and viable TMJ: No pain to palpation with full mobility Oral cavity/oropharynx: No erythema or exudate, no lesions present Neck Neck and Trachea: Midline trachea without mass or lesion Thyroid: No mass or nodularity Lymphatics: No lymphadenopathy   Studies Reviewed: CT Angio 04/11/24 IMPRESSION: No large vessel occlusion.   No high-grade stenosis of the arteries in the head and neck.   No evidence of dissection, aneurysm, or vascular malformation.   No CT evidence of acute intracranial abnormality.      Assessment/Plan: Encounter Diagnoses  Name Primary?   Dizziness Yes   Chronic nonintractable headache, unspecified headache type     Assessment and Plan Assessment & Plan Dizziness  Intermittent dizziness with nausea and vomiting, feeling fatigued and "in the fog' with headache, lasting for hours to days. Differential includes atypical migraine, and postural hypotension vs cardiac issues, vestibular dysfunction is possible but less  likely based on sx presentation/description. Imaging ruled out vascular causes (had normal CT neck Angio in ED). Symptoms inconsistent with benign positional vertigo or labyrinthitis. - Refer to vestibular therapist for assessment and potential therapy to improve vestibular reflexes and posture. - If vestibular therapy is inconclusive, will consider formal vestibular testing   - refer to neurology for evaluation of atypical migraine and potential trial of medication. - If neurology evaluation is inconclusive, follow up with primary care for potential cardiac evaluation. - Advised against taking meclizine    Atypical migraine Symptoms suggestive of atypical migraine, characterized by dizziness and malaise N/V. More common in women approaching perimenopausal years. - Refer to neurology for evaluation and management of atypical migraine.  RTC after vestibular therapy and Neurology evaluation if sx persist  Thank you for allowing me to participate in the care of this patient. Please do not hesitate to contact me with any questions or concerns.   Artice Last, MD Otolaryngology [facility] Phone: [phone] Fax: [phone]    04/15/2024, 11:58 AM

## 2024-04-18 ENCOUNTER — Encounter: Payer: Self-pay | Admitting: Neurology

## 2024-05-04 ENCOUNTER — Ambulatory Visit
Admission: RE | Admit: 2024-05-04 | Discharge: 2024-05-04 | Disposition: A | Discharge status: Home / Self Care | Source: Ambulatory Visit | Attending: Family Medicine

## 2024-05-04 DIAGNOSIS — Z1231 Encounter for screening mammogram for malignant neoplasm of breast: Secondary | ICD-10-CM

## 2024-05-04 NOTE — Therapy (Signed)
 OUTPATIENT PHYSICAL THERAPY VESTIBULAR EVALUATION     Patient Name: Alejandra Patel MRN: [id number] DOB:1981-01-03, 43 y.o., female Today's Date: 05/05/2024  END OF SESSION:  PT End of Session - 05/05/24 1318     Visit Number 1    Number of Visits 5    Date for PT Re-Evaluation 06/04/24    Authorization Type [employer]    PT Start Time 1317    PT Stop Time 1400    PT Time Calculation (min) 43 min    Activity Tolerance Patient tolerated treatment well    Behavior During Therapy WFL for tasks assessed/performed             Past Medical History:  Diagnosis Date   Gestational diabetes    with g2 and g3   Headache(784.0)    Pregnancy induced hypertension    Past Surgical History:  Procedure Laterality Date   CYST EXCISION  10/02/2020   Head   TOOTH EXTRACTION  2017   Patient Active Problem List   Diagnosis Date Noted   Preeclampsia, third trimester 03/06/2021   Antepartum multigravida of advanced maternal age 83/11/2020   Obesity affecting pregnancy, antepartum 10/19/2020   Hypertension affecting pregnancy, antepartum 10/19/2020   Encounter for supervision of other normal pregnancy, first trimester 10/05/2020    PCP: Dorena Gander, MD  REFERRING PROVIDER: Artice Last, MD   REFERRING DIAG: R42 (ICD-10-CM) - Dizziness R26.81 (ICD-10-CM) - Unsteadiness    THERAPY DIAG:  Dizziness and giddiness  ONSET DATE: 04/15/2024   Rationale for Evaluation and Treatment: Rehabilitation  SUBJECTIVE:   SUBJECTIVE STATEMENT: Still feeling a little lightheadedness, but its not bad where she can't be productive. If she is over-doing it then the symptoms start to come in. They have not been to the point that to her to the ER. Have not been nauseous or vomiting. Was not sick before that. Had 2 episodes prior to going to the hospital, thought that she was just stressed out and had a lot going on. These 2 episodes got better with pt resting and closing her eyes for a  couple hours. Reports feeling a little dizzy when she starts doing too much. Has not taken Meclizine . Denies feeling off balance or room spinning. Saw ENT and different diagnosis includes atypical migraine, and postural hypotension vs cardiac issues, vestibular dysfunction is possible but less likely based on sx presentation/description. Has had a lot of craziness in her life this past year.  Pt accompanied by: self  PERTINENT HISTORY: PMH: No significant PMH  On 04/11/24;  presented to ED with a headache, nausea, vertigo.  Patient reports she began having a headache yesterday evening, predominantly the top of her head, but then towards her neck as well.  She denies any injuries to her head or neck, massages, whiplash injury.  She says she began having vertigo today as well which she has had in the past with "riding amusement rides".  However she felt nauseated and vomited this morning as well.     Per ENT: She was prescribed meclizine  but has not taken it as the dizziness subsided by the time she obtained the medication. No history of headaches or migraines outside of these episodes. Notably, this is the first time the symptoms have lasted for several days, whereas in the past, rest for a few hours would alleviate them.   PAIN:  Are you having pain? No  Vitals:   05/05/24 1328 05/05/24 1331  BP: (!) 142/97 (!) 125/91  Pulse: 62  75   Seated, Standing   Pt reports BP normally runs a little high due to anxiety/stress.   PRECAUTIONS: None  FALLS: Has patient fallen in last 6 months? 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  DIAGNOSTIC FINDINGS: CT Angio Head Neck 04/11/24: IMPRESSION: No large vessel occlusion.   No  high-grade stenosis of the arteries in the head and neck.   No evidence of dissection, aneurysm, or vascular malformation.   No CT evidence of acute intracranial abnormality.  COGNITION: Overall cognitive status: Within functional limits for tasks assessed   GAIT: Gait pattern: WFL and step through pattern Distance walked: Clinic distances  Assistive device utilized: None Level of assistance: Complete Independence Comments: No unsteadiness noted   VESTIBULAR ASSESSMENT:  GENERAL OBSERVATION: Ambulates in independently with no AD.    SYMPTOM BEHAVIOR:  Subjective history: See above.   Non-Vestibular symptoms: headaches, nausea/vomiting, and had nausea/vomiting, but not anymore   Type of dizziness: Lightheadedness/Faint, feeling "hazy" , does not feel like her normal self   Frequency: Daily   Duration: Constant hazy feeling   Aggravating factors: doing too much or moving too quickly    Relieving factors: rest  Progression of symptoms: better  OCULOMOTOR EXAM:  Ocular Alignment: normal  Ocular ROM: No Limitations  Spontaneous Nystagmus: absent  Gaze-Induced Nystagmus: absent  Smooth Pursuits: intact  Saccades: intact ~2 saccadic beats in vertical direction, "felt like motor skills in eyes were off"  VESTIBULAR - OCULAR REFLEX:   Slow VOR: Normal and Comment: pt reporting feeling hazy in her forehead area   VOR Cancellation: Normal  Head-Impulse Test: HIT Right: negative HIT Left: negative No symptoms afterwards   Dynamic Visual Acuity: Static: Line 9 Dynamic: Line 7 2 line difference, pt reporting very mild dizziness afterwards    POSITIONAL TESTING: Right Roll Test: no nystagmus Left Roll Test: no nystagmus Right Sidelying: no nystagmus and pt reporting a heavy feeling in her head  Left Sidelying: no nystagmus and pt reporting no pressure, but feeling dizzy when coming to sit up   MOTION SENSITIVITY:  Motion Sensitivity Quotient Intensity: 0 = none, 1 =  Lightheaded, 2 = Mild, 3 = Moderate, 4 = Severe, 5 = Vomiting  Intensity  1. Sitting to supine   2. Supine to L side 0  3. Supine to R side 0  4. Supine to sitting "When coming up pt feeling a smidge of dizziness"   5. L Hallpike-Dix   6. Up from L    7. R Hallpike-Dix   8. Up from R    9. Sitting, head tipped to L knee 0  10. Head up from L knee 0  11. Sitting, head tipped to R knee 0  12. Head up from R knee 2  13. Sitting head turns x5 2  14.Sitting head nods x5 2  15. In stance, 180 turn to L  2  16. In stance, 180 turn to R 2    OTHOSTATICS:  Vitals:   05/05/24 1328 05/05/24 1331  BP: (!) 142/97 (!) 125/91  Pulse: 62 75   Seated, Standing  Pt reports a little lightheadedness, but reports she is just getting used to this feeling. Pt had a drop in BP, but not too  orthostatics     M-CTSIB  Condition 1: Firm Surface, EO 30 Sec, Normal Sway  Condition 2: Firm Surface, EC 30 Sec, Normal Sway  Condition 3: Foam Surface, EO 30 Sec, Normal Sway  Condition 4: Foam Surface, EC 30 Sec, Mild Sway                                                                                                                                TREATMENT DATE:  N/A during eval    PATIENT EDUCATION: Education details: Clinical findings, POC, areas PT will attempt to address in regards to pt's symptoms Person educated: Patient Education method: Explanation Education comprehension: verbalized understanding  HOME EXERCISE PROGRAM: Will provide at future session   GOALS: Goals reviewed with patient? Yes  SHORT TERM GOALS: Target date:ALL STGS = LTGS   LONG TERM GOALS: Target date: 06/02/2024  Pt will be independent with final HEP for dizziness symptoms in order to build upon functional gains made in therapy. Baseline:  Goal status: INITIAL  2.  SOT to be assessed with LTG written if needed  Baseline:  Goal status: INITIAL  3.  Pt will report turning and head motions to a 0/5 on MSQ in  order to demo improved motion sensitivity.  Baseline:  Goal status: INITIAL  ASSESSMENT:  CLINICAL IMPRESSION: Patient is a 43 year old female referred to Neuro OPPT for dizziness.   Pt's PMH is significant for: no significant PMH. The following deficits were present during the exam: pt with slight dizziness after DVA testing (but having normal 2 line difference), motion sensitivity with turning, head motions and supine to sit. Pt negative for positional testing. Pt did have a drop in BP from sitting > standing, but not significant to be orthostatic hypotension. Pt's BP elevated, which pt reports is common for her due to stress/anxiety. Pt following up with PCP regarding this.  Will further assess SOT at next session. PT unsure of cause of pt's dizziness/wooziness at this time. Will trial a short bout of therapy to address motion sensitivity/VOR to see if it will help.    OBJECTIVE IMPAIRMENTS: dizziness.   ACTIVITY LIMITATIONS: locomotion level  PARTICIPATION LIMITATIONS: N/A, pt can participate in daily life, just reports lightheadedness/wooziness   PERSONAL FACTORS: Behavior pattern, Past/current experiences, and Time since onset of injury/illness/exacerbation are also affecting patient's functional outcome.   REHAB POTENTIAL: Good  CLINICAL DECISION MAKING: Evolving/moderate complexity  EVALUATION COMPLEXITY: Moderate   PLAN:  PT FREQUENCY: 1x/week  PT DURATION: 4 weeks  PLANNED INTERVENTIONS: 97164- PT Re-evaluation, 97110-Therapeutic exercises, 97530- Therapeutic activity, 97112- Neuromuscular re-education, 97535- Self Care, 96045- Manual therapy, Patient/Family education, Balance training, and Vestibular training  PLAN FOR NEXT SESSION: assess SOT and write goal if needed, initial HEP for motion sensitivity, VOR, head motions, turning    [place]ron [place], PT, DPT 05/05/2024, 2:33 PM  Check all possible CPT codes: 97164 - PT Re-evaluation, 97110- Therapeutic Exercise,  [phone]-  Neuro Re-education, and 46962 - Therapeutic Activities    Check all conditions that are expected to impact treatment: None of these apply   If treatment provided at initial evaluation, no treatment charged due to lack of authorization.

## 2024-05-05 ENCOUNTER — Ambulatory Visit: Attending: Otolaryngology | Admitting: Physical Therapy

## 2024-05-05 ENCOUNTER — Encounter: Payer: Self-pay | Admitting: Physical Therapy

## 2024-05-05 VITALS — BP 125/91 | HR 75

## 2024-05-05 DIAGNOSIS — R42 Dizziness and giddiness: Secondary | ICD-10-CM | POA: Diagnosis present

## 2024-05-05 DIAGNOSIS — R2681 Unsteadiness on feet: Secondary | ICD-10-CM | POA: Diagnosis not present

## 2024-05-18 ENCOUNTER — Telehealth: Payer: Self-pay | Admitting: Physical Therapy

## 2024-05-18 ENCOUNTER — Ambulatory Visit: Attending: Otolaryngology | Admitting: Physical Therapy

## 2024-05-18 DIAGNOSIS — R42 Dizziness and giddiness: Secondary | ICD-10-CM | POA: Insufficient documentation

## 2024-05-18 NOTE — Telephone Encounter (Signed)
 Called pt regarding no show appt today. Pt reporting that she did not have it in her calendar. Reminded pt of next appt with pt stating she will be there next week.  Jonathan Neighbor, PT, DPT 05/18/24 12:52 PM    [facility] [address] [place], [place]  [address] Phone:  [phone] Fax:  [phone]

## 2024-05-25 ENCOUNTER — Ambulatory Visit: Admitting: Physical Therapy

## 2024-05-25 ENCOUNTER — Encounter: Payer: Self-pay | Admitting: Physical Therapy

## 2024-05-25 VITALS — BP 130/91 | HR 79

## 2024-05-25 DIAGNOSIS — R42 Dizziness and giddiness: Secondary | ICD-10-CM | POA: Diagnosis present

## 2024-05-25 NOTE — Therapy (Signed)
 OUTPATIENT PHYSICAL THERAPY VESTIBULAR TREATMENT     Patient Name: Alejandra Patel MRN: [id number] DOB:[birth year], 43 y.o., female Today's Date: 05/25/2024  END OF SESSION:  PT End of Session - 05/25/24 1237     Visit Number 2    Number of Visits 5    Date for PT Re-Evaluation 06/04/24    Authorization Type [employer]    PT Start Time 1235    PT Stop Time 1315    PT Time Calculation (min) 40 min    Equipment Utilized During Treatment --   SOT harness   Activity Tolerance Patient tolerated treatment well    Behavior During Therapy WFL for tasks assessed/performed          Past Medical History:  Diagnosis Date   Gestational diabetes    with g2 and g3   Headache(784.0)    Pregnancy induced hypertension    Past Surgical History:  Procedure Laterality Date   CYST EXCISION  10/02/2020   Head   TOOTH EXTRACTION  2017   Patient Active Problem List   Diagnosis Date Noted   Preeclampsia, third trimester 03/06/2021   Antepartum multigravida of advanced maternal age 07/19/2020   Obesity affecting pregnancy, antepartum 10/19/2020   Hypertension affecting pregnancy, antepartum 10/19/2020   Encounter for supervision of other normal pregnancy, first trimester 10/05/2020    PCP: Dorena Gander, MD  REFERRING PROVIDER: Artice Last, MD   REFERRING DIAG: R42 (ICD-10-CM) - Dizziness R26.81 (ICD-10-CM) - Unsteadiness    THERAPY DIAG:  Dizziness and giddiness  ONSET DATE: 04/15/2024   Rationale for Evaluation and Treatment: Rehabilitation  SUBJECTIVE:   SUBJECTIVE STATEMENT: Feels much better than she was last here and not as severe. Still feeling a little laggy if turning her head too quickly. Trying not to move too quickly. Still not back to normal behavior of quick movements. Not waking up and feeling dizzy.   Pt accompanied by: self  PERTINENT HISTORY: PMH: No significant PMH  On 04/11/24;  presented to ED with a headache, nausea, vertigo.  Patient reports  she began having a headache yesterday evening, predominantly the top of her head, but then towards her neck as well.  She denies any injuries to her head or neck, massages, whiplash injury.  She says she began having vertigo today as well which she has had in the past with riding amusement rides.  However she felt nauseated and vomited this morning as well.     Per ENT: She was prescribed meclizine  but has not taken it as the dizziness subsided by the time she obtained the medication. No history of headaches or migraines outside of these episodes. Notably, this is the first time the symptoms have lasted for several days, whereas in the past, rest for a few hours would alleviate them.   PAIN:  Are you having pain? No  Vitals:   05/25/24 1242  BP: (!) 130/91  Pulse: 79    PRECAUTIONS: None  FALLS: Has patient fallen in last 6 months? 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  DIAGNOSTIC FINDINGS: CT Angio Head Neck 04/11/24: IMPRESSION: No large vessel occlusion.   No high-grade stenosis of the arteries in the head and neck.   No evidence of dissection, aneurysm,  or vascular malformation.   No CT evidence of acute intracranial abnormality.  COGNITION: Overall cognitive status: Within functional limits for tasks assessed   GAIT: Gait pattern: WFL and step through pattern Distance walked: Clinic distances  Assistive device utilized: None Level of assistance: Complete Independence Comments: No unsteadiness noted   VESTIBULAR ASSESSMENT:  GENERAL OBSERVATION: Ambulates in independently with no AD.    SYMPTOM BEHAVIOR:  Subjective history: See above.   Non-Vestibular symptoms: headaches,  nausea/vomiting, and had nausea/vomiting, but not anymore   Type of dizziness: Lightheadedness/Faint, feeling hazy , does not feel like her normal self   Frequency: Daily   Duration: Constant hazy feeling   Aggravating factors: doing too much or moving too quickly    Relieving factors: rest  Progression of symptoms: better  OCULOMOTOR EXAM:  Ocular Alignment: normal  Ocular ROM: No Limitations  Spontaneous Nystagmus: absent  Gaze-Induced Nystagmus: absent  Smooth Pursuits: intact  Saccades: intact ~2 saccadic beats in vertical direction, felt like motor skills in eyes were off  VESTIBULAR - OCULAR REFLEX:   Slow VOR: Normal and Comment: pt reporting feeling hazy in her forehead area   VOR Cancellation: Normal  Head-Impulse Test: HIT Right: negative HIT Left: negative No symptoms afterwards   Dynamic Visual Acuity: Static: Line 9 Dynamic: Line 7 2 line difference, pt reporting very mild dizziness afterwards    POSITIONAL TESTING: Right Roll Test: no nystagmus Left Roll Test: no nystagmus Right Sidelying: no nystagmus and pt reporting a heavy feeling in her head  Left Sidelying: no nystagmus and pt reporting no pressure, but feeling dizzy when coming to sit up   MOTION SENSITIVITY:  Motion Sensitivity Quotient Intensity: 0 = none, 1 = Lightheaded, 2 = Mild, 3 = Moderate, 4 = Severe, 5 = Vomiting  Intensity  1. Sitting to supine   2. Supine to L side 0  3. Supine to R side 0  4. Supine to sitting When coming up pt feeling a smidge of dizziness   5. L Hallpike-Dix   6. Up from L    7. R Hallpike-Dix   8. Up from R    9. Sitting, head tipped to L knee 0  10. Head up from L knee 0  11. Sitting, head tipped to R knee 0  12. Head up from R knee 2  13. Sitting head turns x5 2  14.Sitting head nods x5 2  15. In stance, 180 turn to L  2  16. In stance, 180 turn to R 2      M-CTSIB  Condition 1: Firm Surface, EO 30 Sec, Normal Sway  Condition 2: Firm Surface, EC 30  Sec, Normal Sway  Condition 3: Foam Surface, EO 30 Sec, Normal Sway  Condition 4: Foam Surface, EC 30 Sec, Mild Sway  TREATMENT DATE: 05/25/24  NMR:  Sensory Organization Test performed with following results: Conditions: 1: 3 trials WNL 2: 3 trials WNL 3: 3 trials WNL 4: 3 trials WNL 5: 2 trials WNL, 1 fall 6: 2 trials WNL, 1 fall Composite score: 74 (above normal)  Sensory Analysis Som: WFL  Vis: WFL  Vest: ~52 slightly below normal  Pref: WFL   Strategy analysis: Equal hip/ankle       COG alignment: to the L        Gaze Adaptation: x1 Viewing Horizontal: Position: Standing, Time: 30 seconds, Reps: 1, and Comment: pt reporting a little lightheadedness  x1 Viewing Vertical:  Position: Standing, Time: 30 seconds, Reps: 1, and Comment: pt reporting a little lightheadedness, felt motor skills were off a little   On air ex: Wide BOS with EC: 5 reps head turns, 5 reps head nods, mild postural sway    PATIENT EDUCATION: Education details: Results of SOT and purpose of each 3 balance systems, initial HEP for VOR and vestibular system for balance  Person educated: Patient Education method: Explanation, Verbal cues, and Handouts Education comprehension: verbalized understanding, returned demonstration, and verbal cues required  HOME EXERCISE PROGRAM: Standing VOR x1 - 30 seconds horizontal and vertical   Access Code: [email or web address] URL: [email or web address] Date: 05/25/2024 Prepared by: Jonathan Neighbor  Exercises - Wide Stance with Eyes Closed and Head Rotation on Foam Pad  - 1 x daily - 5 x weekly - 2 sets - 5 reps and head turns 5 reps   GOALS: Goals reviewed with patient? Yes  SHORT TERM GOALS: Target date:ALL STGS = LTGS   LONG TERM GOALS: Target date: 06/02/2024  Pt will be independent with final HEP for dizziness symptoms in  order to build upon functional gains made in therapy. Baseline:  Goal status: INITIAL  2.  SOT to be assessed with LTG written if needed  Baseline: goal not needed - pt only slightly below normal for vestibular input Goal status:N/A  3.  Pt will report turning and head motions to a 0/5 on MSQ in order to demo improved motion sensitivity.  Baseline:  Goal status: INITIAL  ASSESSMENT:  CLINICAL IMPRESSION: Pt reporting overall dizziness symptoms are better since eval. Assessed SOT today with pt scoring above normal on all tasks except slightly below for vestibular input on sensory analysis. See above for further details. LTG not needed. Remainder of session focused on initiating HEP for standing VOR x1 and balance with EC with head motions. Pt with incr postural sway with balance tasks with EC and pt reporting mild lightheadedness after VOR x1. Will continue per POC.    OBJECTIVE IMPAIRMENTS: dizziness.   ACTIVITY LIMITATIONS: locomotion level  PARTICIPATION LIMITATIONS: N/A, pt can participate in daily life, just reports lightheadedness/wooziness   PERSONAL FACTORS: Behavior pattern, Past/current experiences, and Time since onset of injury/illness/exacerbation are also affecting patient's functional outcome.   REHAB POTENTIAL: Good  CLINICAL DECISION MAKING: Evolving/moderate complexity  EVALUATION COMPLEXITY: Moderate   PLAN:  PT FREQUENCY: 1x/week  PT DURATION: 4 weeks  PLANNED INTERVENTIONS: 97164- PT Re-evaluation, 97110-Therapeutic exercises, 97530- Therapeutic activity, 97112- Neuromuscular re-education, 97535- Self Care, 54098- Manual therapy, Patient/Family education, Balance training, and Vestibular training  PLAN FOR NEXT SESSION: work on motion sensitivity, VOR, head motions, turning    Seabron [place]ress, PT, DPT 05/25/2024, 1:27 PM  Check all possible CPT codes: 97164 - PT Re-evaluation, 97110- Therapeutic Exercise, [phone]- Neuro Re-education, and [phone] -  Therapeutic Activities    Check all  conditions that are expected to impact treatment: None of these apply   If treatment provided at initial evaluation, no treatment charged due to lack of authorization.

## 2024-05-25 NOTE — Patient Instructions (Signed)
 Gaze Stabilization: Standing Feet Apart    Feet shoulder width apart, keeping eyes on target on wall __a few__ feet away, tilt head down 15-30 and move head side to side for _30___ seconds. Repeat while moving head up and down for _30___ seconds.  Perform 2-3 sets of each.  Do __1-2_ sessions per day.  Gaze Stabilization: Tip Card  1.Target must remain in focus, not blurry, and appear stationary while head is in motion. 2.Perform exercises with small head movements (45 to either side of midline). 3.Increase speed of head motion so long as target is in focus. 4.If you wear eyeglasses, be sure you can see target through lens (therapist will give specific instructions for bifocal / progressive lenses). 5.These exercises may provoke dizziness or nausea. Work through these symptoms. If too dizzy, slow head movement slightly. Rest between each exercise. 6.Exercises demand concentration; avoid distractions. 7.For safety, perform standing exercises close to a counter, wall, corner, or next to someone.

## 2024-06-01 ENCOUNTER — Ambulatory Visit: Admitting: Physical Therapy

## 2024-06-01 ENCOUNTER — Encounter: Payer: Self-pay | Admitting: Physical Therapy

## 2024-06-01 VITALS — BP 138/99 | HR 81

## 2024-06-01 DIAGNOSIS — R42 Dizziness and giddiness: Secondary | ICD-10-CM

## 2024-06-01 NOTE — Therapy (Signed)
 OUTPATIENT PHYSICAL THERAPY VESTIBULAR TREATMENT     Patient Name: Alejandra Patel MRN: [id number] DOB:[birth year], 43 y.o., female Today's Date: 06/01/2024  END OF SESSION:  PT End of Session - 06/01/24 1017     Visit Number 3    Number of Visits 5    Date for PT Re-Evaluation 06/04/24    Authorization Type [employer] Medicaid    PT Start Time 1016    PT Stop Time 1050   session ended early due to elevated BP   PT Time Calculation (min) 34 min    Equipment Utilized During Treatment --    Activity Tolerance Patient tolerated treatment well    Behavior During Therapy WFL for tasks assessed/performed          Past Medical History:  Diagnosis Date   Gestational diabetes    with g2 and g3   Headache(784.0)    Pregnancy induced hypertension    Past Surgical History:  Procedure Laterality Date   CYST EXCISION  10/02/2020   Head   TOOTH EXTRACTION  2017   Patient Active Problem List   Diagnosis Date Noted   Preeclampsia, third trimester 03/06/2021   Antepartum multigravida of advanced maternal age 25/11/2020   Obesity affecting pregnancy, antepartum 10/19/2020   Hypertension affecting pregnancy, antepartum 10/19/2020   Encounter for supervision of other normal pregnancy, first trimester 10/05/2020    PCP: Rolinda Millman, MD  REFERRING PROVIDER: Okey Burns, MD   REFERRING DIAG: R42 (ICD-10-CM) - Dizziness R26.81 (ICD-10-CM) - Unsteadiness    THERAPY DIAG:  Dizziness and giddiness  ONSET DATE: 04/15/2024   Rationale for Evaluation and Treatment: Rehabilitation  SUBJECTIVE:   SUBJECTIVE STATEMENT: Feeling better, feels like hazy feeling is lifting. Was able to work in her garden last night. Did a lot of weeding and raked it. Head did not feel crazy. Took a shower when washing her hair and didn't feel like she was gonna fall over. Haven't noticed anything that made her feel off. Multi-tasks all day.    Pt accompanied by: self  PERTINENT HISTORY: PMH: No  significant PMH  On 04/11/24;  presented to ED with a headache, nausea, vertigo.  Patient reports she began having a headache yesterday evening, predominantly the top of her head, but then towards her neck as well.  She denies any injuries to her head or neck, massages, whiplash injury.  She says she began having vertigo today as well which she has had in the past with riding amusement rides.  However she felt nauseated and vomited this morning as well.     Per ENT: She was prescribed meclizine  but has not taken it as the dizziness subsided by the time she obtained the medication. No history of headaches or migraines outside of these episodes. Notably, this is the first time the symptoms have lasted for several days, whereas in the past, rest for a few hours would alleviate them.   PAIN:  Are you having pain? No  Vitals:   06/01/24 1023 06/01/24 1044  BP: (!) 123/96 (!) 138/99  Pulse: 79 81     PRECAUTIONS: None  FALLS: Has patient fallen in last 6 months? 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  DIAGNOSTIC FINDINGS: CT Angio Head Neck 04/11/24:  IMPRESSION: No large vessel occlusion.   No high-grade stenosis of the arteries in the head and neck.   No evidence of dissection, aneurysm, or vascular malformation.   No CT evidence of acute intracranial abnormality.  COGNITION: Overall cognitive status: Within functional limits for tasks assessed   GAIT: Gait pattern: WFL and step through pattern Distance walked: Clinic distances  Assistive device utilized: None Level of assistance: Complete Independence Comments: No unsteadiness noted   VESTIBULAR ASSESSMENT:  GENERAL OBSERVATION: Ambulates in  independently with no AD.    SYMPTOM BEHAVIOR:  Subjective history: See above.   Non-Vestibular symptoms: headaches, nausea/vomiting, and had nausea/vomiting, but not anymore   Type of dizziness: Lightheadedness/Faint, feeling hazy , does not feel like her normal self   Frequency: Daily   Duration: Constant hazy feeling   Aggravating factors: doing too much or moving too quickly    Relieving factors: rest  Progression of symptoms: better  OCULOMOTOR EXAM:  Ocular Alignment: normal  Ocular ROM: No Limitations  Spontaneous Nystagmus: absent  Gaze-Induced Nystagmus: absent  Smooth Pursuits: intact  Saccades: intact ~2 saccadic beats in vertical direction, felt like motor skills in eyes were off  VESTIBULAR - OCULAR REFLEX:   Slow VOR: Normal and Comment: pt reporting feeling hazy in her forehead area   VOR Cancellation: Normal  Head-Impulse Test: HIT Right: negative HIT Left: negative No symptoms afterwards   Dynamic Visual Acuity: Static: Line 9 Dynamic: Line 7 2 line difference, pt reporting very mild dizziness afterwards    POSITIONAL TESTING: Right Roll Test: no nystagmus Left Roll Test: no nystagmus Right Sidelying: no nystagmus and pt reporting a heavy feeling in her head  Left Sidelying: no nystagmus and pt reporting no pressure, but feeling dizzy when coming to sit up   MOTION SENSITIVITY:  Motion Sensitivity Quotient Intensity: 0 = none, 1 = Lightheaded, 2 = Mild, 3 = Moderate, 4 = Severe, 5 = Vomiting  Intensity  1. Sitting to supine   2. Supine to L side 0  3. Supine to R side 0  4. Supine to sitting When coming up pt feeling a smidge of dizziness   5. L Hallpike-Dix   6. Up from L    7. R Hallpike-Dix   8. Up from R    9. Sitting, head tipped to L knee 0  10. Head up from L knee 0  11. Sitting, head tipped to R knee 0  12. Head up from R knee 2  13. Sitting head turns x5 2  14.Sitting head nods x5 2  15. In stance, 180 turn to L  2  16. In stance,  180 turn to R 2      M-CTSIB  Condition 1: Firm Surface, EO 30 Sec, Normal Sway  Condition 2: Firm Surface, EC 30 Sec, Normal Sway  Condition 3: Foam Surface, EO 30 Sec, Normal Sway  Condition 4: Foam Surface, EC 30 Sec, Mild Sway  TREATMENT DATE: 06/01/24  Therapeutic Activity: Vitals:   06/01/24 1023 06/01/24 1044  BP: (!) 123/96 (!) 138/99  Pulse: 79 81   Assessed BP and elevated, but just barely within limits for therapy. After 2nd reading after activity, pt reporting feeling a pressure behind her R eye from after the exercises.   Pt reports that she hasn't been monitoring BP at home and hasn't been eating the best the past few days. Educated on BP parameters for therapy (when diastolic is 100 or more) and will hold remainder of PT session due to elevated BP reading and just below 100. Pt reports that she was on BP medication prior and then her PCP changed it to anti-anxiety medication. Discussed importance of assessing BP and home and keeping a log and showing it to PCP and scheduling a follow-up visit with PCP, pt verbalized understanding and planning to reach out to PCP to discuss this with medication management.   NMR:   Gaze Adaptation: x1 Viewing Horizontal: Position: Standing with busy background, Time: 30 seconds, Reps: 2, and Comment: 2nd rep on air ex, no symptoms x1 Viewing Vertical:  Position: Standing with busy background, Time: 30 seconds, Reps: 1, and Comment: 2nd rep on air ex, no symptoms   Forward gait with gaze stabilization while holding ball, performing CW and CCW directions, 3 x 15' each, pt reporting a very mild haziness, more challenged with going to CCW direction   On air ex: Narrow BOS > feet together: EC 2 x 10 reps head turns, 2 x 10 reps head nods, mild postural sway  EC marching 2 x 10 reps slowly for incr vestibular  input   PATIENT EDUCATION: Education details: HEP updates- progressing standing VOR x1 for home with a busy background and EC with head motions reps to 10 and feet closer together, gaze stab with walking, see therapeutic activity section above  Person educated: Patient Education method: Explanation, Verbal cues, and Handouts Education comprehension: verbalized understanding, returned demonstration, and verbal cues required  HOME EXERCISE PROGRAM: Standing VOR x1 - 30 seconds horizontal and vertical - progressed to a busy background  Walking with gaze stabilization when holding ball in CW/CCW directions  Access Code: [email or web address] URL: [email or web address] Date: 05/25/2024 Prepared by: Sheffield Senate  Exercises - Wide Stance with Eyes Closed and Head Rotation on Foam Pad  - 1 x daily - 5 x weekly - 2 sets - 5 reps and head turns 5 reps - progressed to feet closer together and 10 reps of head motions on 06/01/24  GOALS: Goals reviewed with patient? Yes  SHORT TERM GOALS: Target date:ALL STGS = LTGS   LONG TERM GOALS: Target date: 06/02/2024  Pt will be independent with final HEP for dizziness symptoms in order to build upon functional gains made in therapy. Baseline:  Goal status: INITIAL  2.  SOT to be assessed with LTG written if needed  Baseline: goal not needed - pt only slightly below normal for vestibular input Goal status:N/A  3.  Pt will report turning and head motions to a 0/5 on MSQ in order to demo improved motion sensitivity.  Baseline:  Goal status: INITIAL  ASSESSMENT:  CLINICAL IMPRESSION: Pt reporting symptoms are better and not having as much as a hazy feeling since the last time she was here. Pt has been able to do more at home (such as gardening) without symptoms. Pt's BP initially elevated, but [facility] for therapy. Worked on standing gaze stabilization exercises and EC balance for vestibular input  and progressed exercises as appropriate for HEP. Pt  only reporting minimal hazy feeling with gaze stabilization with ball during gait. After exercises, assessed BP and diastolic was more elevated, held remainder of PT session at this time as pt also reported a pressure behind R eye from exercises. Educated pt on importance of following up with PCP regarding BP management and keeping a lot at home. Pt verbalized understanding. Will continue per POC.    OBJECTIVE IMPAIRMENTS: dizziness.   ACTIVITY LIMITATIONS: locomotion level  PARTICIPATION LIMITATIONS: N/A, pt can participate in daily life, just reports lightheadedness/wooziness   PERSONAL FACTORS: Behavior pattern, Past/current experiences, and Time since onset of injury/illness/exacerbation are also affecting patient's functional outcome.   REHAB POTENTIAL: Good  CLINICAL DECISION MAKING: Evolving/moderate complexity  EVALUATION COMPLEXITY: Moderate   PLAN:  PT FREQUENCY: 1x/week  PT DURATION: 4 weeks  PLANNED INTERVENTIONS: 97164- PT Re-evaluation, 97110-Therapeutic exercises, 97530- Therapeutic activity, 97112- Neuromuscular re-education, 97535- Self Care, [id number]- Manual therapy, Patient/Family education, Balance training, and Vestibular training  PLAN FOR NEXT SESSION: work on motion sensitivity, VOR, head motions, turning, D/C if symptoms are good?    Sheffield LOISE Senate, PT, DPT 06/01/2024, 10:51 AM  Check all possible CPT codes: 02835 - PT Re-evaluation, 97110- Therapeutic Exercise, [phone]- Neuro Re-education, and [phone] - Therapeutic Activities    Check all conditions that are expected to impact treatment: None of these apply   If treatment provided at initial evaluation, no treatment charged due to lack of authorization.

## 2024-06-07 ENCOUNTER — Encounter: Admitting: Physical Therapy

## 2024-07-04 ENCOUNTER — Encounter: Payer: Self-pay | Admitting: Neurology

## 2024-08-23 NOTE — Progress Notes (Unsigned)
 NEUROLOGY CONSULTATION NOTE  Alejandra Patel MRN: [id number] DOB: 1981/09/30  Referring provider: Elena Larry, MD Primary care provider: Vernell Fort, MD  Reason for consult:  headache and dizziness  Assessment/Plan:   Intractable vestibular migraine, improved   Migraine prevention:  Defer.  Not indicated at this time. Migraine rescue:  If she should have a recurrence, she will try sumatriptan  100mg  and Zofran -ODT 4mg  Lifestyle modification: Limit use of pain relievers to no more than 9 days out of the month to prevent risk of rebound or medication-overuse headache. Diet modification/hydration/caffeine cessation Routine exercise Sleep hygiene Consider vitamins/supplements:  magnesium  citrate 400mg  daily, riboflavin 400mg  daily, CoQ10 100mg  three times daily Keep headache diary Follow up 6 months.   Subjective:  Alejandra Patel is a 43 year old right-handed female who presents for headache and dizziness.  History supplemented by ED and referring provider's notes.  CT/CTA personally reviewed.    She began experiencing episodes of dizziness around February-March 2025.  She describes the dizziness as a sensation of feeling drunk but not a spinning sensation.  It was aggravated with movement and improved with a nap.  There was associated nausea and vomiting as well as photophobia and fatigue.  She also had accompanied headache, described as a 3-4/10 throbbing pain in the back and/or right side of her head.  Episodes lasted from hours to days.  She was seen in the ED on 04/11/2024 where CT head and CTA head and neck were unremarkable.  She followed up with ENT on 04/15/2024 who referred to vestibular rehab and to neurology for possible migraine.  Vestibular testing at physical therapy was negative.  She did respond to therapy.  Symptoms steadily tapered off over the next couple of months and has resolved.  At the time when symptoms started, she was under a great deal of  family-related emotional stress.  For a brief time in 2016, she was experiencing severe headaches with loud white noise tinnitus that would make her up in the middle of the night.  They subsequently resolved.  Otherwise, she has no other history of headaches.  Past NSAIDS/analgesics:  ASA, diclofenac  75mg , meloxicam  15mg  Past abortive triptans:  none Past abortive ergotamine:  none Past muscle relaxants:  none Past anti-emetic:  Zofran  4mg  Past antihypertensive medications:  labetolol Past antidepressant medications:  none Past anticonvulsant medications:  none Past anti-CGRP:  none Past vitamins/Herbal/Supplements:  magnesium  Past antihistamines/decongestants:  meclizine  Other past therapies:  none  Current NSAIDS/analgesics:  none Current triptans:  none Current ergotamine:  none Current anti-emetic:  none Current muscle relaxants:  none Current Antihypertensive medications:  none Current Antidepressant medications:  none Current Anticonvulsant medications:  none Current anti-CGRP:  none Current Vitamins/Herbal/Supplements:  none Current Antihistamines/Decongestants:  none Other therapy:  none Birth control:  none   Caffeine:  Occasional black tea; rarely drinks coffee and soda. Exercise:  not routine Depression/Anxiety:  Emotional stress still ongoing issue but no longer experiencing crying spells.       PAST MEDICAL HISTORY: Past Medical History:  Diagnosis Date   Gestational diabetes    with g2 and g3   Headache(784.0)    Pregnancy induced hypertension     PAST SURGICAL HISTORY: Past Surgical History:  Procedure Laterality Date   CYST EXCISION  10/02/2020   Head   TOOTH EXTRACTION  2017    MEDICATIONS: Current Outpatient Medications on File Prior to Visit  Medication Sig Dispense Refill   albuterol  (VENTOLIN  HFA) 108 (90 Base) MCG/ACT inhaler Inhale  1-2 puffs into the lungs every 6 (six) hours as needed for wheezing or shortness of breath. 6.7 g 0    Blood Pressure Monitoring (BLOOD PRESSURE KIT) DEVI 1 kit by Does not apply route once a week. 1 each 0   Multiple Vitamin (MULTIVITAMIN) tablet Take 1 tablet by mouth daily.     ibuprofen  (ADVIL ) 800 MG tablet Take 1 tablet (800 mg total) by mouth every 8 (eight) hours as needed. (Patient not taking: Reported on 08/24/2024) 30 tablet 0   NIFEdipine  (ADALAT  CC) 30 MG 24 hr tablet Take 1 tablet (30 mg total) by mouth daily. (Patient not taking: Reported on 08/24/2024) 30 tablet 3   promethazine -dextromethorphan (PROMETHAZINE -DM) 6.25-15 MG/5ML syrup Take 5 mLs by mouth every 8 (eight) hours as needed for cough. (Patient not taking: Reported on 08/24/2024) 180 mL 0   No current facility-administered medications on file prior to visit.     ALLERGIES: Allergies  Allergen Reactions   Benadryl [Diphenhydramine Hcl] Swelling    FAMILY HISTORY: Family History  Problem Relation Age of Onset   Ovarian cysts Sister     Objective:  Blood pressure (!) 141/98, pulse (!) 57, height 5' 7 (1.702 m), weight 248 lb (112.5 kg), SpO2 98%, unknown if currently breastfeeding. General: No acute distress.  Patient appears well-groomed.   Head:  Normocephalic/atraumatic Eyes:  fundi examined but not visualized Neck: supple, no paraspinal tenderness, full range of motion Heart: regular rate and rhythm Neurological Exam: Mental status: alert and oriented to person, place, and time, speech fluent and not dysarthric, language intact. Cranial nerves: CN I: not tested CN II: pupils equal, round and reactive to light, visual fields intact CN III, IV, VI:  full range of motion, no nystagmus, no ptosis CN V: facial sensation intact. CN VII: upper and lower face symmetric CN VIII: hearing intact CN IX, X: gag intact, uvula midline CN XI: sternocleidomastoid and trapezius muscles intact CN XII: tongue midline Bulk & Tone: normal, no fasciculations. Motor:  muscle strength 5/5 throughout Sensation:  Pinprick and  vibratory sensation intact. Deep Tendon Reflexes:  2+ throughout,  toes downgoing.   Finger to nose testing:  Without dysmetria.   Gait:  Normal station and stride.  Romberg negative.    Thank you for allowing me to take part in the care of this patient.  Juliene Dunnings, DO  CC:  Vernell Fort, MD  Elena Larry, MD

## 2024-08-24 ENCOUNTER — Ambulatory Visit (INDEPENDENT_AMBULATORY_CARE_PROVIDER_SITE_OTHER): Admitting: Neurology

## 2024-08-24 ENCOUNTER — Encounter: Payer: Self-pay | Admitting: Neurology

## 2024-08-24 VITALS — BP 141/98 | HR 57 | Ht 67.0 in | Wt 248.0 lb

## 2024-08-24 DIAGNOSIS — G43809 Other migraine, not intractable, without status migrainosus: Secondary | ICD-10-CM

## 2024-08-24 MED ORDER — ONDANSETRON 4 MG PO TBDP: 4.0000 mg | ORAL_TABLET | Freq: Three times a day (TID) | ORAL | 1 refills | Status: AC | PRN

## 2024-08-24 MED ORDER — SUMATRIPTAN SUCCINATE 100 MG PO TABS: ORAL_TABLET | ORAL | 1 refills | Status: AC

## 2024-08-24 NOTE — Patient Instructions (Addendum)
 If you should get another episode (headache, dizziness), take sumatriptan  at earliest onset.  May repeat after 2 hours.  Maximum 2 tablets in 24 hours. Take ondansetron  for nausea as needed Routine exercise Look at migraine diet Follow up 6 months

## 2024-09-06 ENCOUNTER — Ambulatory Visit: Admitting: Neurology

## 2025-03-01 ENCOUNTER — Ambulatory Visit: Admitting: Neurology
